# Patient Record
Sex: Female | Born: 1990 | Race: White | Hispanic: No | Marital: Married | Smoking: Never smoker
Health system: Southern US, Community
[De-identification: ages and names within clinical notes are randomized; demographics above are authoritative.]

## PROBLEM LIST (undated history)

## (undated) DIAGNOSIS — I10 Essential (primary) hypertension: Secondary | ICD-10-CM

## (undated) DIAGNOSIS — E039 Hypothyroidism, unspecified: Secondary | ICD-10-CM

## (undated) DIAGNOSIS — F419 Anxiety disorder, unspecified: Secondary | ICD-10-CM

## (undated) HISTORY — DX: Essential (primary) hypertension: I10

## (undated) HISTORY — DX: Anxiety disorder, unspecified: F41.9

## (undated) HISTORY — PX: WISDOM TOOTH EXTRACTION: SHX21

## (undated) HISTORY — DX: Hypothyroidism, unspecified: E03.9

---

## 2013-11-03 LAB — HM PAP SMEAR

## 2015-12-29 DIAGNOSIS — F4001 Agoraphobia with panic disorder: Secondary | ICD-10-CM | POA: Diagnosis not present

## 2015-12-29 DIAGNOSIS — F411 Generalized anxiety disorder: Secondary | ICD-10-CM | POA: Diagnosis not present

## 2016-09-20 DIAGNOSIS — R5383 Other fatigue: Secondary | ICD-10-CM | POA: Diagnosis not present

## 2016-09-20 DIAGNOSIS — Z87898 Personal history of other specified conditions: Secondary | ICD-10-CM | POA: Diagnosis not present

## 2016-11-03 ENCOUNTER — Encounter: Payer: Self-pay | Admitting: Family Medicine

## 2016-11-03 ENCOUNTER — Ambulatory Visit (INDEPENDENT_AMBULATORY_CARE_PROVIDER_SITE_OTHER): Payer: BLUE CROSS/BLUE SHIELD | Admitting: Family Medicine

## 2016-11-03 VITALS — BP 112/76 | HR 76 | Temp 98.1°F | Resp 16 | Ht 63.5 in | Wt 187.4 lb

## 2016-11-03 DIAGNOSIS — Z01419 Encounter for gynecological examination (general) (routine) without abnormal findings: Secondary | ICD-10-CM | POA: Diagnosis not present

## 2016-11-03 DIAGNOSIS — E669 Obesity, unspecified: Secondary | ICD-10-CM

## 2016-11-03 DIAGNOSIS — E663 Overweight: Secondary | ICD-10-CM | POA: Insufficient documentation

## 2016-11-03 DIAGNOSIS — F419 Anxiety disorder, unspecified: Secondary | ICD-10-CM | POA: Diagnosis not present

## 2016-11-03 DIAGNOSIS — R5383 Other fatigue: Secondary | ICD-10-CM | POA: Diagnosis not present

## 2016-11-03 LAB — CBC WITH DIFFERENTIAL/PLATELET
BASOS ABS: 0.2 10*3/uL — AB (ref 0.0–0.1)
BASOS PCT: 2.1 % (ref 0.0–3.0)
Eosinophils Absolute: 0.1 10*3/uL (ref 0.0–0.7)
Eosinophils Relative: 1.2 % (ref 0.0–5.0)
HEMATOCRIT: 39 % (ref 36.0–46.0)
Hemoglobin: 13 g/dL (ref 12.0–15.0)
LYMPHS PCT: 28.5 % (ref 12.0–46.0)
Lymphs Abs: 2.1 10*3/uL (ref 0.7–4.0)
MCHC: 33.4 g/dL (ref 30.0–36.0)
MCV: 85.5 fl (ref 78.0–100.0)
Monocytes Absolute: 0.4 10*3/uL (ref 0.1–1.0)
Monocytes Relative: 5.9 % (ref 3.0–12.0)
NEUTROS ABS: 4.5 10*3/uL (ref 1.4–7.7)
Neutrophils Relative %: 62.3 % (ref 43.0–77.0)
PLATELETS: 284 10*3/uL (ref 150.0–400.0)
RBC: 4.56 Mil/uL (ref 3.87–5.11)
RDW: 13.3 % (ref 11.5–15.5)
WBC: 7.3 10*3/uL (ref 4.0–10.5)

## 2016-11-03 LAB — BASIC METABOLIC PANEL
BUN: 18 mg/dL (ref 6–23)
CHLORIDE: 104 meq/L (ref 96–112)
CO2: 28 mEq/L (ref 19–32)
Calcium: 9.4 mg/dL (ref 8.4–10.5)
Creatinine, Ser: 0.93 mg/dL (ref 0.40–1.20)
GFR: 76.68 mL/min (ref >60.00)
Glucose, Bld: 95 mg/dL (ref 70–99)
POTASSIUM: 4.1 meq/L (ref 3.5–5.1)
Sodium: 138 mEq/L (ref 135–145)

## 2016-11-03 LAB — TSH: TSH: 1.9 u[IU]/mL (ref 0.35–4.50)

## 2016-11-03 LAB — T4, FREE: FREE T4: 0.68 ng/dL (ref 0.60–1.60)

## 2016-11-03 LAB — B12 AND FOLATE PANEL
FOLATE: 19.9 ng/mL (ref >5.9)
VITAMIN B 12: 427 pg/mL (ref 211–911)

## 2016-11-03 LAB — T3, FREE: T3 FREE: 3.6 pg/mL (ref 2.3–4.2)

## 2016-11-03 LAB — VITAMIN D 25 HYDROXY (VIT D DEFICIENCY, FRACTURES): VITD: 23.4 ng/mL — ABNORMAL LOW (ref 30.00–100.00)

## 2016-11-03 MED ORDER — SERTRALINE HCL 25 MG PO TABS: 25.0000 mg | ORAL_TABLET | Freq: Every day | ORAL | 3 refills | Status: DC

## 2016-11-03 NOTE — Patient Instructions (Signed)
Follow up in 2-3 months to recheck weight loss progress (unless labs indicate sooner!) We'll notify you of your lab results and make any changes if needed Continue to work on healthy diet and regular exercise- you can do it! We'll call you with your GYN appt Decrease the Zoloft to  daily Call with any questions or concerns Welcome!  We're glad to have you!!!

## 2016-11-03 NOTE — Assessment & Plan Note (Signed)
New to provider.  Ongoing for pt but worsening recently.  Check labs to assess for underlying metabolic causes.  Encouraged healthy diet and regular exercise- will follow.

## 2016-11-03 NOTE — Assessment & Plan Note (Signed)
New to provider, ongoing for pt.  Tends to center mostly on travel- particularly flight.  Feels day to day sxs are well controlled and is trying to wean off Zoloft.  Will decrease to 25 mg daily and monitor.  Pt expressed understanding and is in agreement w/ plan.

## 2016-11-03 NOTE — Assessment & Plan Note (Signed)
New.  Pt is very upset by her recent weight gain (in the last year).  Is attempting to eat well but admits to no exercise.  Stressed the need for both.  Check labs to r/o metabolic causes such as hypothyroid.  Will follow closely.

## 2016-11-03 NOTE — Progress Notes (Signed)
   Subjective:    Patient ID: Nancy Wong, female    DOB: November 03, 1990, 26 y.o.   MRN: [id number]  HPI New to establish.  Previous MD- Dr Crista Elliot, [place].  Needs GYN referral.  Weight gain/hot flashes- pt reports she has gained 'considerable weight' in the last year.  Pt reports she has been eating better but continues to gain weight.  Admits she is not exercising.  + 'exhausted'.  Pt reports she is sleeping 7-8 hrs/night but now requiring 1-2 hrs of sleep on the couch when she gets home from work.  + hot flashes.  Pt reports last TSH was 'mid to upper 3s'.  No family hx of thyroid issues.  Pt is considering pregnancy in the next year or so.  Pt is off birth control and periods are fairly regularly.  No reported snoring or breathing pauses.  Anxiety- chronic problem, currently on Zoloft  daily.  She is attempting to wean in hopes of pregnancy upcoming.  Pt tends to have severe anxiety with travel  Review of Systems For ROS see HPI     Objective:   Physical Exam  Constitutional: She is oriented to person, place, and time. She appears well-developed and well-nourished. No distress.  obese  HENT:  Head: Normocephalic and atraumatic.  Eyes: Conjunctivae and EOM are normal. Pupils are equal, round, and reactive to light.  Neck: Normal range of motion. Neck supple. No thyromegaly present.  Cardiovascular: Normal rate, regular rhythm, normal heart sounds and intact distal pulses.   No murmur heard. Pulmonary/Chest: Effort normal and breath sounds normal. No respiratory distress.  Abdominal: Soft. She exhibits no distension. There is no tenderness.  Musculoskeletal: She exhibits no edema.  Lymphadenopathy:    She has no cervical adenopathy.  Neurological: She is alert and oriented to person, place, and time.  Skin: Skin is warm and dry.  Psychiatric: She has a normal mood and affect. Her behavior is normal.  Vitals reviewed.         Assessment & Plan:

## 2016-11-03 NOTE — Progress Notes (Signed)
Pre visit review using our clinic review tool, if applicable. No additional management support is needed unless otherwise documented below in the visit note. 

## 2016-11-04 ENCOUNTER — Other Ambulatory Visit: Payer: Self-pay | Admitting: General Practice

## 2016-11-04 ENCOUNTER — Other Ambulatory Visit: Payer: Self-pay | Admitting: Family Medicine

## 2016-11-04 DIAGNOSIS — R7989 Other specified abnormal findings of blood chemistry: Secondary | ICD-10-CM

## 2016-11-04 DIAGNOSIS — R799 Abnormal finding of blood chemistry, unspecified: Secondary | ICD-10-CM

## 2016-11-04 MED ORDER — VITAMIN D (ERGOCALCIFEROL) 1.25 MG (50000 UNIT) PO CAPS: 50000.0000 [IU] | ORAL_CAPSULE | ORAL | 0 refills | Status: DC

## 2016-11-04 MED ORDER — LEVOTHYROXINE SODIUM 25 MCG PO TABS: 25.0000 ug | ORAL_TABLET | Freq: Every day | ORAL | 3 refills | Status: DC

## 2016-11-22 DIAGNOSIS — Z6833 Body mass index (BMI) 33.0-33.9, adult: Secondary | ICD-10-CM | POA: Diagnosis not present

## 2016-11-22 DIAGNOSIS — Z01419 Encounter for gynecological examination (general) (routine) without abnormal findings: Secondary | ICD-10-CM | POA: Diagnosis not present

## 2016-11-22 DIAGNOSIS — D509 Iron deficiency anemia, unspecified: Secondary | ICD-10-CM | POA: Diagnosis not present

## 2016-11-22 DIAGNOSIS — R5383 Other fatigue: Secondary | ICD-10-CM | POA: Diagnosis not present

## 2016-11-22 LAB — HM PAP SMEAR

## 2016-12-06 ENCOUNTER — Other Ambulatory Visit (INDEPENDENT_AMBULATORY_CARE_PROVIDER_SITE_OTHER): Payer: BLUE CROSS/BLUE SHIELD

## 2016-12-06 DIAGNOSIS — R799 Abnormal finding of blood chemistry, unspecified: Secondary | ICD-10-CM | POA: Diagnosis not present

## 2016-12-06 DIAGNOSIS — R7989 Other specified abnormal findings of blood chemistry: Secondary | ICD-10-CM

## 2016-12-06 LAB — TSH: TSH: 0.72 u[IU]/mL (ref 0.35–4.50)

## 2016-12-20 DIAGNOSIS — Z713 Dietary counseling and surveillance: Secondary | ICD-10-CM | POA: Diagnosis not present

## 2016-12-20 DIAGNOSIS — Z6831 Body mass index (BMI) 31.0-31.9, adult: Secondary | ICD-10-CM | POA: Diagnosis not present

## 2017-01-07 ENCOUNTER — Encounter: Payer: Self-pay | Admitting: General Practice

## 2017-01-10 ENCOUNTER — Ambulatory Visit (INDEPENDENT_AMBULATORY_CARE_PROVIDER_SITE_OTHER): Payer: BLUE CROSS/BLUE SHIELD | Admitting: Family Medicine

## 2017-01-10 ENCOUNTER — Encounter: Payer: Self-pay | Admitting: Family Medicine

## 2017-01-10 VITALS — BP 118/78 | HR 78 | Temp 98.0°F | Resp 16 | Ht 64.0 in | Wt 175.4 lb

## 2017-01-10 DIAGNOSIS — E669 Obesity, unspecified: Secondary | ICD-10-CM

## 2017-01-10 DIAGNOSIS — F419 Anxiety disorder, unspecified: Secondary | ICD-10-CM | POA: Diagnosis not present

## 2017-01-10 MED ORDER — ALPRAZOLAM 0.25 MG PO TABS: ORAL_TABLET | ORAL | 3 refills | Status: DC

## 2017-01-10 NOTE — Assessment & Plan Note (Signed)
Improved.  Pt has lost 12 lbs since last visit.  OB put her on Phentermine but she has also changed her diet and began exercising more regularly since her energy level has improved.  Will continue to follow.

## 2017-01-10 NOTE — Patient Instructions (Signed)
Schedule your complete physical for October Keep up the good work on [employer]healthy diet and regular exercise- you're doing great!!! Continue to use the Alprazolam only as needed Call with any questions or concerns Have a great summer!!!

## 2017-01-10 NOTE — Progress Notes (Signed)
   Subjective:    Patient ID: Nancy Wong, female    DOB: September 29, 1990, 26 y.o.   MRN: [id number]  HPI Obesity- pt has lost 12 lbs since last visit.  Pt is now taking Phentermine 37.5mg  daily.  Pt reports she is now walking more regularly, eating better.  Pt has also done some intermittent fasting.  Pt feels energy is better- no longer requiring a daily nap.  No CP, SOB, HAs, visual changes, edema.  Anxiety- pt is taking 50mg  Zoloft once daily.  Asking for refill on Alprazolam to use as needed.   Review of Systems For ROS see HPI     Objective:   Physical Exam  Constitutional: She is oriented to person, place, and time. She appears well-developed and well-nourished. No distress.  HENT:  Head: Normocephalic and atraumatic.  Eyes: Conjunctivae and EOM are normal. Pupils are equal, round, and reactive to light.  Neck: Normal range of motion. Neck supple. No thyromegaly present.  Cardiovascular: Normal rate, regular rhythm, normal heart sounds and intact distal pulses.   No murmur heard. Pulmonary/Chest: Effort normal and breath sounds normal. No respiratory distress.  Abdominal: Soft. She exhibits no distension. There is no tenderness.  Musculoskeletal: She exhibits no edema.  Lymphadenopathy:    She has no cervical adenopathy.  Neurological: She is alert and oriented to person, place, and time.  Skin: Skin is warm and dry.  Psychiatric: She has a normal mood and affect. Her behavior is normal.  Vitals reviewed.         Assessment & Plan:

## 2017-01-10 NOTE — Assessment & Plan Note (Signed)
Chronic problem.  Refill provided on Alprazolam.  Pt is again on Zoloft 50mg  daily.  Will follow.

## 2017-01-10 NOTE — Progress Notes (Signed)
Pre visit review using our clinic review tool, if applicable. No additional management support is needed unless otherwise documented below in the visit note. 

## 2017-01-14 ENCOUNTER — Ambulatory Visit: Payer: Self-pay | Admitting: Family Medicine

## 2017-02-08 DIAGNOSIS — J069 Acute upper respiratory infection, unspecified: Secondary | ICD-10-CM | POA: Diagnosis not present

## 2017-03-22 ENCOUNTER — Other Ambulatory Visit: Payer: Self-pay | Admitting: Family Medicine

## 2017-04-18 ENCOUNTER — Telehealth: Payer: Self-pay | Admitting: Family Medicine

## 2017-04-18 NOTE — Telephone Encounter (Signed)
Pt is on alprazolam for flying, pt took prior to flying. She felt her heart racing before take off so she took a second alprazolam before take off. Pt states that her anxiety attacks cause her to have severe nausea/vomiting. She proceeded to vomit the entire [place] practiced her breathing techniques and could not get herself to calm down until her flight landed.   Pt states that she is suppose to fly on Thursday to [place] and she is already feeling very nervous and panicky about this trip. Mostly because she has been on the Alprazolam for years for her anxiety and this is the first time it has not worked.   Please advise?

## 2017-04-18 NOTE — Telephone Encounter (Signed)
I would recommend taking 2 tabs at the same time (0.5mg ) prior to flying and based on her recent experience, I would take it upon arrival to the airport.  She can add an additional tab if needed upon boarding or mid-flight

## 2017-04-18 NOTE — Telephone Encounter (Signed)
Patient notified of PCP recommendations and is agreement and expresses an understanding.  

## 2017-04-18 NOTE — Telephone Encounter (Signed)
Pt states that she was on a trip to [place] for work in which she has a panic attack, she states that the xanax did not work very well for her. Pt states that she has another work trip at the end of the week traveling to [place], pt asking what else could she do, please advise

## 2017-05-16 ENCOUNTER — Encounter: Payer: BLUE CROSS/BLUE SHIELD | Admitting: Family Medicine

## 2017-07-06 ENCOUNTER — Encounter: Payer: Self-pay | Admitting: Family Medicine

## 2017-07-06 ENCOUNTER — Ambulatory Visit (INDEPENDENT_AMBULATORY_CARE_PROVIDER_SITE_OTHER): Payer: BLUE CROSS/BLUE SHIELD | Admitting: Family Medicine

## 2017-07-06 VITALS — BP 140/80 | HR 125 | Temp 98.9°F

## 2017-07-06 DIAGNOSIS — Z3A01 Less than 8 weeks gestation of pregnancy: Secondary | ICD-10-CM | POA: Diagnosis not present

## 2017-07-06 DIAGNOSIS — R6889 Other general symptoms and signs: Secondary | ICD-10-CM | POA: Diagnosis not present

## 2017-07-06 DIAGNOSIS — F419 Anxiety disorder, unspecified: Secondary | ICD-10-CM | POA: Diagnosis not present

## 2017-07-06 DIAGNOSIS — B349 Viral infection, unspecified: Secondary | ICD-10-CM | POA: Diagnosis not present

## 2017-07-06 DIAGNOSIS — R000 Tachycardia, unspecified: Secondary | ICD-10-CM | POA: Diagnosis not present

## 2017-07-06 LAB — POCT RAPID STREP A (OFFICE): RAPID STREP A SCREEN: NEGATIVE

## 2017-07-06 LAB — TSH: TSH: 1.05 u[IU]/mL (ref 0.35–4.50)

## 2017-07-06 LAB — T4, FREE: Free T4: 0.74 ng/dL (ref 0.60–1.60)

## 2017-07-06 LAB — POC INFLUENZA A&B (BINAX/QUICKVUE)
INFLUENZA B, POC: NEGATIVE
Influenza A, POC: NEGATIVE

## 2017-07-06 LAB — HCG, QUANTITATIVE, PREGNANCY: QUANTITATIVE HCG: 880.28 m[IU]/mL

## 2017-07-06 MED ORDER — ONDANSETRON 4 MG PO TBDP: 4.0000 mg | ORAL_TABLET | Freq: Once | ORAL | Status: DC

## 2017-07-06 MED ORDER — ONDANSETRON HCL 4 MG PO TABS: 4.0000 mg | ORAL_TABLET | Freq: Three times a day (TID) | ORAL | 0 refills | Status: DC | PRN

## 2017-07-06 MED ORDER — ONDANSETRON HCL 4 MG/2ML IJ SOLN
4.0000 mg | Freq: Once | INTRAMUSCULAR | Status: AC
  Administered 2017-07-06: 4 mg via INTRAMUSCULAR

## 2017-07-06 NOTE — Progress Notes (Signed)
Subjective   CC:  Chief Complaint  Patient presents with  . Hyperemesis Gravidarum    [redacted] weeks pregnant, home preg test positive  . Fever    99.9 to 100.4 past 24 hours, started last night  . Cough    x 2 days, expectorating green sputum    HPI: Nancy Wong is a 26 y.o. female who presents to the office today to address the problems listed above in the chief complaint.  Patient complains of flu like symptoms including myalgias, fever tto 100.4 last night, no antipyretics this am, ST, mild cough and some congestion, cough and nausea. Sxs have been present since late last night.No SOB or CP are present. Taking in fluids adequately; decreased appetite bt no significant v/d. She has not had the flu vaccine this season.   She is 3 days late for her menstrual cycles which are regular.  And at home urine pregnancy test was positive.  This is good news for her and her husband.  She is very anxious however, how this illness will affect the pregnancy.  Anxiety: She has been maintained well recently on low-dose Zoloft.  She admits that she is feeling very anxious and would like to increase her dose.  She has not used Xanax recently.  Thyroid replacement: She was started on low-dose thyroid replacement in April.  TSH and free T4 were normal at that time.  She denies palpitations or tremors.  Anxiety is increased.  I reviewed the patients updated PMH, FH, and SocHx.    Patient Active Problem List   Diagnosis Date Noted  . Obesity (BMI 30-39.9) 11/03/2016  . Fatigue 11/03/2016  . Anxiety 11/03/2016   Current Meds  Medication Sig  . levothyroxine (SYNTHROID, LEVOTHROID) 25 MCG tablet TAKE 1 TABLET(25 MCG) BY MOUTH DAILY BEFORE BREAKFAST  . sertraline (ZOLOFT) 100 MG tablet Take 50 mg by mouth daily.   Family History: Patient family history includes Cancer in her mother; Diabetes in her maternal grandmother and paternal grandfather; Healthy in her brother; Heart attack in her paternal  grandfather; Kidney disease in her father. Social History:  Patient  reports that  has never smoked. she has never used smokeless tobacco. She reports that she does not drink alcohol or use drugs.  Review of Systems: Constitutional: negative for fever or malaise Ophthalmic: negative for photophobia, double vision or loss of vision Cardiovascular: negative for chest pain, dyspnea on exertion, or new LE swelling Respiratory: negative for SOB or persistent cough Gastrointestinal: negative for abdominal pain, change in bowel habits or melena Genitourinary: negative for dysuria or gross hematuria Musculoskeletal: negative for new gait disturbance or muscular weakness Integumentary: negative for new or persistent rashes Neurological: negative for TIA or stroke symptoms Psychiatric: negative for SI or delusions  Allergic/Immunologic: negative for hives  Objective  Vitals: BP 140/80 (BP Location: Left Arm, Patient Position: Sitting, Cuff Size: Normal)   Pulse (!) 125   Temp 98.9 F (37.2 C) (Oral)   LMP 06/04/2017   SpO2 98%  General: no acute respiratory distress , lying in bed with wet cloth and forehead. Psych:  Alert and oriented, anxious mood and affect HEENT: Normocephalic, nasal congestion present, TMs w/o erythema, OP with erythema w/o exudate, no LAD, supple neck  Cardiovascular: Tachycardic with regular rhythm without murmur or gallop. no peripheral edema Respiratory:  Good breath sounds bilaterally, CTAB with normal respiratory effort, no rales or wheezing Gastrointestinal: soft, flat abdomen, normal active bowel sounds, no palpable masses, no hepatosplenomegaly, no appreciated  hernias Skin:  Warm, no rashes Neurologic:   Mental status is normal. normal gait, no tremor  Assessment  1. Viral syndrome   2. Flu-like symptoms   3. Less than [redacted] weeks gestation of pregnancy   4. Anxiety   5. Tachycardia      Plan   Symptoms most consistent with a viral syndrome, flulike  illness.  Will support conservatively given early pregnancy.  Counseling done.  Recommend Tylenol for fever or body aches.  Robitussin for cough.  Zofran for nausea as needed.  Discussed risks and benefits of medications.  Avoid Advil if possible.  Avoid Xanax.    Anxiety: May increase Zoloft to 50 mg daily.  Thyroid replacement: Recheck TSH to ensure not hyperthyroid.  Patient will need to make an appointment with OB in the next 1-3 months.  Follow up: Follow-up in 48 hours if not improved.   Commons side effects, risks, benefits, and alternatives for medications and treatment plan prescribed today were discussed, and the patient expressed understanding of the given instructions. Patient is instructed to call or message via MyChart if he/she has any questions or concerns regarding our treatment plan. No barriers to understanding were identified. We discussed Red Flag symptoms and signs in detail. Patient expressed understanding regarding what to do in case of urgent or emergency type symptoms.   Medication list was reconciled, printed and provided to the patient in AVS. Patient instructions and summary information was reviewed with the patient as documented in the AVS. This note was prepared with assistance of Dragon voice recognition software. Occasional wrong-word or sound-a-like substitutions may have occurred due to the inherent limitations of voice recognition software  Orders Placed This Encounter  Procedures  . TSH  . T4, free  . B-HCG Quant  . POC Influenza A&B (Binax test)  . POCT rapid strep A   Meds ordered this encounter  Medications  . DISCONTD: ondansetron (ZOFRAN-ODT) disintegrating tablet 4 mg  . ondansetron (ZOFRAN) 4 MG tablet    Sig: Take 1 tablet (4 mg total) by mouth every 8 (eight) hours as needed for nausea or vomiting.    Dispense:  20 tablet    Refill:  0  . ondansetron (ZOFRAN) injection 4 mg

## 2017-07-06 NOTE — Patient Instructions (Signed)
Sorry you are not feeling well. Please keep hydrated - water, gatorade, clear liquids/soups etc. Stick to a bland diet right now.  Use the zofran as needed for nausea. Use Tylenol for aches and fevers. Avoid advil if possible.  You may follow up in 48 hours if not improving.    Viral Respiratory Infection A viral respiratory infection is an illness that affects parts of the body used for breathing, like the lungs, nose, and throat. It is caused by a germ called a virus. Some examples of this kind of infection are:  A cold.  The flu (influenza).  A respiratory syncytial virus (RSV) infection.  How do I know if I have this infection? Most of the time this infection causes:  A stuffy or runny nose.  Yellow or green fluid in the nose.  A cough.  Sneezing.  Tiredness (fatigue).  Achy muscles.  A sore throat.  Sweating or chills.  A fever.  A headache.  How is this infection treated? If the flu is diagnosed early, it may be treated with an antiviral medicine. This medicine shortens the length of time a person has symptoms. Symptoms may be treated with over-the-counter and prescription medicines, such as:  Expectorants. These make it easier to cough up mucus.  Decongestant nasal sprays.  Doctors do not prescribe antibiotic medicines for viral infections. They do not work with this kind of infection. How do I know if I should stay home? To keep others from getting sick, stay home if you have:  A fever.  A lasting cough.  A sore throat.  A runny nose.  Sneezing.  Muscles aches.  Headaches.  Tiredness.  Weakness.  Chills.  Sweating.  An upset stomach (nausea).  Follow these instructions at home:  Rest as much as possible.  Take over-the-counter and prescription medicines only as told by your doctor.  Drink enough fluid to keep your pee (urine) clear or pale yellow.  Gargle with salt water. Do this 3-4 times per day or as needed. To make a  salt-water mixture, dissolve -1 tsp of salt in 1 cup of warm water. Make sure the salt dissolves all the way.  Use nose drops made from salt water. This helps with stuffiness (congestion). It also helps soften the skin around your nose.  Do not drink alcohol.  Do not use tobacco products, including cigarettes, chewing tobacco, and e-cigarettes. If you need help quitting, ask your doctor. Get help if:  Your symptoms last for 10 days or longer.  Your symptoms get worse over time.  You have a fever.  You have very bad pain in your face or forehead.  Parts of your jaw or neck become very swollen. Get help right away if:  You feel pain or pressure in your chest.  You have shortness of breath.  You faint or feel like you will faint.  You keep throwing up (vomiting).  You feel confused. This information is not intended to replace advice given to you by your health care provider. Make sure you discuss any questions you have with your health care provider. Document Released: 06/17/2008 Document Revised: 12/11/2015 Document Reviewed: 12/11/2014 Elsevier Interactive Patient Education  2018 [employer]

## 2017-07-08 ENCOUNTER — Ambulatory Visit: Payer: BLUE CROSS/BLUE SHIELD | Admitting: Family Medicine

## 2017-07-28 DIAGNOSIS — N911 Secondary amenorrhea: Secondary | ICD-10-CM | POA: Diagnosis not present

## 2017-08-03 DIAGNOSIS — Z23 Encounter for immunization: Secondary | ICD-10-CM | POA: Diagnosis not present

## 2017-08-03 DIAGNOSIS — Z3401 Encounter for supervision of normal first pregnancy, first trimester: Secondary | ICD-10-CM | POA: Diagnosis not present

## 2017-08-03 DIAGNOSIS — Z3685 Encounter for antenatal screening for Streptococcus B: Secondary | ICD-10-CM | POA: Diagnosis not present

## 2017-08-03 LAB — OB RESULTS CONSOLE ANTIBODY SCREEN: Antibody Screen: NEGATIVE

## 2017-08-03 LAB — OB RESULTS CONSOLE RPR: RPR: NONREACTIVE

## 2017-08-03 LAB — OB RESULTS CONSOLE GC/CHLAMYDIA
Chlamydia: NEGATIVE
GC PROBE AMP, GENITAL: NEGATIVE

## 2017-08-03 LAB — OB RESULTS CONSOLE ABO/RH: RH Type: POSITIVE

## 2017-08-03 LAB — OB RESULTS CONSOLE RUBELLA ANTIBODY, IGM: RUBELLA: IMMUNE

## 2017-08-03 LAB — OB RESULTS CONSOLE HIV ANTIBODY (ROUTINE TESTING): HIV: NONREACTIVE

## 2017-08-03 LAB — OB RESULTS CONSOLE HEPATITIS B SURFACE ANTIGEN: Hepatitis B Surface Ag: NEGATIVE

## 2017-08-15 DIAGNOSIS — Z113 Encounter for screening for infections with a predominantly sexual mode of transmission: Secondary | ICD-10-CM | POA: Diagnosis not present

## 2017-08-15 DIAGNOSIS — Z3481 Encounter for supervision of other normal pregnancy, first trimester: Secondary | ICD-10-CM | POA: Diagnosis not present

## 2017-08-15 DIAGNOSIS — Z3A10 10 weeks gestation of pregnancy: Secondary | ICD-10-CM | POA: Diagnosis not present

## 2017-08-15 DIAGNOSIS — Z3400 Encounter for supervision of normal first pregnancy, unspecified trimester: Secondary | ICD-10-CM | POA: Diagnosis not present

## 2017-08-15 DIAGNOSIS — Z3480 Encounter for supervision of other normal pregnancy, unspecified trimester: Secondary | ICD-10-CM | POA: Diagnosis not present

## 2017-08-15 DIAGNOSIS — Z3401 Encounter for supervision of normal first pregnancy, first trimester: Secondary | ICD-10-CM | POA: Diagnosis not present

## 2017-08-30 DIAGNOSIS — Z3683 Encounter for fetal screening for congenital cardiac abnormalities: Secondary | ICD-10-CM | POA: Diagnosis not present

## 2017-09-28 ENCOUNTER — Telehealth: Payer: BLUE CROSS/BLUE SHIELD | Admitting: Nurse Practitioner

## 2017-09-28 DIAGNOSIS — J309 Allergic rhinitis, unspecified: Secondary | ICD-10-CM | POA: Diagnosis not present

## 2017-09-28 DIAGNOSIS — R112 Nausea with vomiting, unspecified: Secondary | ICD-10-CM

## 2017-09-28 NOTE — Progress Notes (Signed)
Based on what you shared with me it looks like you have a serious condition that should be evaluated in a face to face office visit.  NOTE: If you entered your credit card information for this eVisit, you will not be charged. You may see a "hold" on your card for the $30 but that hold will drop off and you will not have a charge processed.   *ypu really ned to see your OB/GYN due to your pregnancy    If you are having a true medical emergency please call 911.  If you need an urgent face to face visit, [place] has four urgent care centers for your convenience.  If you need care fast and have a high deductible or no insurance consider:   [email or web address] to reserve your spot online an avoid wait times  [facility] [address], [address] [place], [place] [address] 8 am to 8 pm Monday-Friday 10 am to 4 pm Saturday-Sunday *Across the street from [employer]  [facility]  [address] [place], [id number] 8 am to 5 pm Monday-Friday * In the [facility] on the [facility]   The following sites will take your  insurance:  . [facility]  [phone] Get Driving Directions Find a Provider at this Location  [address] [place], [place] [address] . 10 am to 8 pm Monday-Friday . 12 pm to 8 pm Saturday-Sunday   . [facility]  [phone] Get Driving Directions Find a Provider at this Location  [address] [place], [place] [address] . 8 am to 8 pm Monday-Friday . 9 am to 6 pm Saturday . 11 am to 6 pm Sunday   . [facility] [place]  [phone] Get Driving Directions  [address] [place], [place] [address] . 8 am to 8 pm Monday-Friday . 8 am to 4 pm Saturday-Sunday   Your e-visit answers were reviewed by a board certified advanced clinical practitioner to complete your personal care plan.  Thank you for  using e-Visits.

## 2017-10-10 DIAGNOSIS — Z363 Encounter for antenatal screening for malformations: Secondary | ICD-10-CM | POA: Diagnosis not present

## 2017-10-10 DIAGNOSIS — Z3A18 18 weeks gestation of pregnancy: Secondary | ICD-10-CM | POA: Diagnosis not present

## 2017-11-14 DIAGNOSIS — Z362 Encounter for other antenatal screening follow-up: Secondary | ICD-10-CM | POA: Diagnosis not present

## 2017-11-18 DIAGNOSIS — R8271 Bacteriuria: Secondary | ICD-10-CM | POA: Diagnosis not present

## 2017-11-18 DIAGNOSIS — O9989 Other specified diseases and conditions complicating pregnancy, childbirth and the puerperium: Secondary | ICD-10-CM | POA: Diagnosis not present

## 2017-11-18 DIAGNOSIS — M7989 Other specified soft tissue disorders: Secondary | ICD-10-CM | POA: Diagnosis not present

## 2017-11-18 DIAGNOSIS — Z3A24 24 weeks gestation of pregnancy: Secondary | ICD-10-CM | POA: Diagnosis not present

## 2017-12-13 DIAGNOSIS — Z23 Encounter for immunization: Secondary | ICD-10-CM | POA: Diagnosis not present

## 2017-12-15 DIAGNOSIS — Z3480 Encounter for supervision of other normal pregnancy, unspecified trimester: Secondary | ICD-10-CM | POA: Diagnosis not present

## 2017-12-19 DIAGNOSIS — O99810 Abnormal glucose complicating pregnancy: Secondary | ICD-10-CM | POA: Diagnosis not present

## 2017-12-19 DIAGNOSIS — Z3A28 28 weeks gestation of pregnancy: Secondary | ICD-10-CM | POA: Diagnosis not present

## 2018-02-06 DIAGNOSIS — Z3A35 35 weeks gestation of pregnancy: Secondary | ICD-10-CM | POA: Diagnosis not present

## 2018-02-06 DIAGNOSIS — Z3688 Encounter for antenatal screening for fetal macrosomia: Secondary | ICD-10-CM | POA: Diagnosis not present

## 2018-02-06 DIAGNOSIS — Z3685 Encounter for antenatal screening for Streptococcus B: Secondary | ICD-10-CM | POA: Diagnosis not present

## 2018-02-06 LAB — OB RESULTS CONSOLE GBS: GBS: NEGATIVE

## 2018-02-16 DIAGNOSIS — Z3480 Encounter for supervision of other normal pregnancy, unspecified trimester: Secondary | ICD-10-CM | POA: Diagnosis not present

## 2018-02-16 DIAGNOSIS — Z3A36 36 weeks gestation of pregnancy: Secondary | ICD-10-CM | POA: Diagnosis not present

## 2018-02-16 DIAGNOSIS — O26893 Other specified pregnancy related conditions, third trimester: Secondary | ICD-10-CM | POA: Diagnosis not present

## 2018-02-16 DIAGNOSIS — I10 Essential (primary) hypertension: Secondary | ICD-10-CM | POA: Diagnosis not present

## 2018-02-17 ENCOUNTER — Encounter (HOSPITAL_COMMUNITY): Payer: Self-pay | Admitting: *Deleted

## 2018-02-17 ENCOUNTER — Telehealth (HOSPITAL_COMMUNITY): Payer: Self-pay | Admitting: *Deleted

## 2018-02-17 NOTE — Telephone Encounter (Signed)
Preadmission screen  

## 2018-02-20 ENCOUNTER — Inpatient Hospital Stay (HOSPITAL_COMMUNITY): Payer: BLUE CROSS/BLUE SHIELD | Admitting: Anesthesiology

## 2018-02-20 ENCOUNTER — Inpatient Hospital Stay (HOSPITAL_COMMUNITY)
Admission: RE | Admit: 2018-02-20 | Discharge: 2018-02-23 | DRG: 788 | Disposition: A | Discharge status: Home / Self Care | Payer: BLUE CROSS/BLUE SHIELD | Attending: Obstetrics and Gynecology | Admitting: Obstetrics and Gynecology

## 2018-02-20 ENCOUNTER — Encounter (HOSPITAL_COMMUNITY): Payer: Self-pay

## 2018-02-20 ENCOUNTER — Other Ambulatory Visit: Payer: Self-pay

## 2018-02-20 ENCOUNTER — Encounter (HOSPITAL_COMMUNITY)
Admission: RE | Disposition: A | Discharge status: Home / Self Care | Payer: Self-pay | Source: Home / Self Care | Attending: Obstetrics and Gynecology

## 2018-02-20 DIAGNOSIS — O1404 Mild to moderate pre-eclampsia, complicating childbirth: Secondary | ICD-10-CM | POA: Diagnosis not present

## 2018-02-20 DIAGNOSIS — E039 Hypothyroidism, unspecified: Secondary | ICD-10-CM | POA: Diagnosis not present

## 2018-02-20 DIAGNOSIS — O1490 Unspecified pre-eclampsia, unspecified trimester: Secondary | ICD-10-CM | POA: Diagnosis present

## 2018-02-20 DIAGNOSIS — Z98891 History of uterine scar from previous surgery: Secondary | ICD-10-CM

## 2018-02-20 DIAGNOSIS — Z3A37 37 weeks gestation of pregnancy: Secondary | ICD-10-CM | POA: Diagnosis not present

## 2018-02-20 DIAGNOSIS — F419 Anxiety disorder, unspecified: Secondary | ICD-10-CM | POA: Diagnosis present

## 2018-02-20 DIAGNOSIS — O99284 Endocrine, nutritional and metabolic diseases complicating childbirth: Secondary | ICD-10-CM | POA: Diagnosis present

## 2018-02-20 DIAGNOSIS — O99344 Other mental disorders complicating childbirth: Secondary | ICD-10-CM | POA: Diagnosis not present

## 2018-02-20 DIAGNOSIS — O1494 Unspecified pre-eclampsia, complicating childbirth: Principal | ICD-10-CM | POA: Diagnosis present

## 2018-02-20 DIAGNOSIS — O134 Gestational [pregnancy-induced] hypertension without significant proteinuria, complicating childbirth: Secondary | ICD-10-CM | POA: Diagnosis not present

## 2018-02-20 HISTORY — DX: History of uterine scar from previous surgery: Z98.891

## 2018-02-20 HISTORY — DX: Unspecified pre-eclampsia, unspecified trimester: O14.90

## 2018-02-20 LAB — COMPREHENSIVE METABOLIC PANEL
ALK PHOS: 146 U/L — AB (ref 38–126)
ALT: 18 U/L (ref 0–44)
ALT: 18 U/L (ref 0–44)
ANION GAP: 12 (ref 5–15)
AST: 19 U/L (ref 15–41)
AST: 30 U/L (ref 15–41)
Albumin: 3 g/dL — ABNORMAL LOW (ref 3.5–5.0)
Albumin: 3 g/dL — ABNORMAL LOW (ref 3.5–5.0)
Alkaline Phosphatase: 140 U/L — ABNORMAL HIGH (ref 38–126)
Anion gap: 11 (ref 5–15)
BILIRUBIN TOTAL: 0.4 mg/dL (ref 0.3–1.2)
BUN: 19 mg/dL (ref 6–20)
BUN: 16 mg/dL (ref 6–20)
CALCIUM: 8.4 mg/dL — AB (ref 8.9–10.3)
CHLORIDE: 101 mmol/L (ref 98–111)
CO2: 18 mmol/L — ABNORMAL LOW (ref 22–32)
CO2: 19 mmol/L — AB (ref 22–32)
CREATININE: 0.93 mg/dL (ref 0.44–1.00)
Calcium: 9.3 mg/dL (ref 8.9–10.3)
Chloride: 105 mmol/L (ref 98–111)
Creatinine, Ser: 0.92 mg/dL (ref 0.44–1.00)
GFR calc Af Amer: >60 mL/min (ref >60)
GFR calc non Af Amer: >60 mL/min (ref >60)
GLUCOSE: 93 mg/dL (ref 70–99)
Glucose, Bld: 93 mg/dL (ref 70–99)
Potassium: 4 mmol/L (ref 3.5–5.1)
Potassium: 4.1 mmol/L (ref 3.5–5.1)
SODIUM: 132 mmol/L — AB (ref 135–145)
Sodium: 134 mmol/L — ABNORMAL LOW (ref 135–145)
TOTAL PROTEIN: 6.2 g/dL — AB (ref 6.5–8.1)
Total Bilirubin: 0.7 mg/dL (ref 0.3–1.2)
Total Protein: 6.9 g/dL (ref 6.5–8.1)

## 2018-02-20 LAB — CBC
HCT: 33.5 % — ABNORMAL LOW (ref 36.0–46.0)
HCT: 35.2 % — ABNORMAL LOW (ref 36.0–46.0)
HCT: 35.7 % — ABNORMAL LOW (ref 36.0–46.0)
HEMATOCRIT: 31.9 % — AB (ref 36.0–46.0)
HEMOGLOBIN: 12.4 g/dL (ref 12.0–15.0)
Hemoglobin: 11.5 g/dL — ABNORMAL LOW (ref 12.0–15.0)
Hemoglobin: 12 g/dL (ref 12.0–15.0)
Hemoglobin: 10.9 g/dL — ABNORMAL LOW (ref 12.0–15.0)
MCH: 30.2 pg (ref 26.0–34.0)
MCH: 29.9 pg (ref 26.0–34.0)
MCH: 30.5 pg (ref 26.0–34.0)
MCH: 30.4 pg (ref 26.0–34.0)
MCHC: 34.3 g/dL (ref 30.0–36.0)
MCHC: 34.1 g/dL (ref 30.0–36.0)
MCHC: 34.7 g/dL (ref 30.0–36.0)
MCHC: 34.2 g/dL (ref 30.0–36.0)
MCV: 87.9 fL (ref 78.0–100.0)
MCV: 87.8 fL (ref 78.0–100.0)
MCV: 87.9 fL (ref 78.0–100.0)
MCV: 89.1 fL (ref 78.0–100.0)
PLATELETS: 191 10*3/uL (ref 150–400)
PLATELETS: 209 10*3/uL (ref 150–400)
Platelets: 213 10*3/uL (ref 150–400)
Platelets: 197 10*3/uL (ref 150–400)
RBC: 3.81 MIL/uL — ABNORMAL LOW (ref 3.87–5.11)
RBC: 4.01 MIL/uL (ref 3.87–5.11)
RBC: 4.06 MIL/uL (ref 3.87–5.11)
RBC: 3.58 MIL/uL — AB (ref 3.87–5.11)
RDW: 13.7 % (ref 11.5–15.5)
RDW: 13.5 % (ref 11.5–15.5)
RDW: 13.7 % (ref 11.5–15.5)
RDW: 13.9 % (ref 11.5–15.5)
WBC: 10.2 10*3/uL (ref 4.0–10.5)
WBC: 11.1 10*3/uL — ABNORMAL HIGH (ref 4.0–10.5)
WBC: 12.5 10*3/uL — AB (ref 4.0–10.5)
WBC: 12.6 10*3/uL — AB (ref 4.0–10.5)

## 2018-02-20 LAB — ABO/RH: ABO/RH(D): A POS

## 2018-02-20 LAB — TYPE AND SCREEN
ABO/RH(D): A POS
Antibody Screen: NEGATIVE

## 2018-02-20 LAB — RPR: RPR Ser Ql: NONREACTIVE

## 2018-02-20 SURGERY — Surgical Case
Anesthesia: Epidural

## 2018-02-20 MED ORDER — MISOPROSTOL 25 MCG QUARTER TABLET
25.0000 ug | ORAL_TABLET | ORAL | Status: DC | PRN
  Administered 2018-02-20 (×3): 25 ug via VAGINAL
  Filled 2018-02-20 (×3): qty 1

## 2018-02-20 MED ORDER — ONDANSETRON HCL 4 MG/2ML IJ SOLN: 4.0000 mg | Freq: Three times a day (TID) | INTRAMUSCULAR | Status: DC | PRN

## 2018-02-20 MED ORDER — DEXAMETHASONE SODIUM PHOSPHATE 4 MG/ML IJ SOLN
INTRAMUSCULAR | Status: DC | PRN
  Administered 2018-02-20: 4 mg via INTRAVENOUS

## 2018-02-20 MED ORDER — ACETAMINOPHEN 325 MG PO TABS: 650.0000 mg | ORAL_TABLET | ORAL | Status: DC | PRN

## 2018-02-20 MED ORDER — DIPHENHYDRAMINE HCL 25 MG PO CAPS: 25.0000 mg | ORAL_CAPSULE | Freq: Four times a day (QID) | ORAL | Status: DC | PRN

## 2018-02-20 MED ORDER — SIMETHICONE 80 MG PO CHEW: 80.0000 mg | CHEWABLE_TABLET | ORAL | Status: DC | PRN

## 2018-02-20 MED ORDER — EPHEDRINE 5 MG/ML INJ: 10.0000 mg | INTRAVENOUS | Status: DC | PRN

## 2018-02-20 MED ORDER — FENTANYL CITRATE (PF) 100 MCG/2ML IJ SOLN
INTRAMUSCULAR | Status: AC
  Filled 2018-02-20: qty 2

## 2018-02-20 MED ORDER — ZOLPIDEM TARTRATE 5 MG PO TABS: 5.0000 mg | ORAL_TABLET | Freq: Every evening | ORAL | Status: DC | PRN

## 2018-02-20 MED ORDER — ONDANSETRON HCL 4 MG/2ML IJ SOLN
INTRAMUSCULAR | Status: AC
  Filled 2018-02-20: qty 4

## 2018-02-20 MED ORDER — LIDOCAINE HCL (PF) 1 % IJ SOLN
INTRAMUSCULAR | Status: DC | PRN
  Administered 2018-02-20 (×2): 5 mL via EPIDURAL

## 2018-02-20 MED ORDER — PHENYLEPHRINE HCL 10 MG/ML IJ SOLN
INTRAMUSCULAR | Status: DC | PRN
  Administered 2018-02-20: 40 ug via INTRAVENOUS

## 2018-02-20 MED ORDER — FENTANYL 2.5 MCG/ML BUPIVACAINE 1/10 % EPIDURAL INFUSION (WH - ANES)
14.0000 mL/h | INTRAMUSCULAR | Status: DC | PRN
  Administered 2018-02-20: 14 mL/h via EPIDURAL
  Filled 2018-02-20: qty 100

## 2018-02-20 MED ORDER — SERTRALINE HCL 50 MG PO TABS
50.0000 mg | ORAL_TABLET | Freq: Every day | ORAL | Status: DC
  Administered 2018-02-21 – 2018-02-22 (×3): 50 mg via ORAL
  Filled 2018-02-20 (×4): qty 1

## 2018-02-20 MED ORDER — OXYCODONE-ACETAMINOPHEN 5-325 MG PO TABS: 2.0000 | ORAL_TABLET | ORAL | Status: DC | PRN

## 2018-02-20 MED ORDER — FENTANYL CITRATE (PF) 100 MCG/2ML IJ SOLN
INTRAMUSCULAR | Status: DC | PRN
  Administered 2018-02-20: 100 ug via EPIDURAL

## 2018-02-20 MED ORDER — SODIUM BICARBONATE 8.4 % IV SOLN
INTRAVENOUS | Status: AC
  Filled 2018-02-20: qty 50

## 2018-02-20 MED ORDER — HYDROXYZINE HCL 50 MG PO TABS
50.0000 mg | ORAL_TABLET | Freq: Four times a day (QID) | ORAL | Status: DC | PRN
  Administered 2018-02-20: 50 mg via ORAL
  Filled 2018-02-20 (×2): qty 1

## 2018-02-20 MED ORDER — OXYCODONE-ACETAMINOPHEN 5-325 MG PO TABS
1.0000 | ORAL_TABLET | ORAL | Status: DC | PRN
  Administered 2018-02-21 – 2018-02-23 (×2): 1 via ORAL
  Filled 2018-02-20 (×2): qty 1

## 2018-02-20 MED ORDER — LEVOTHYROXINE SODIUM 25 MCG PO TABS
25.0000 ug | ORAL_TABLET | Freq: Every day | ORAL | Status: DC
  Administered 2018-02-23: 25 ug via ORAL
  Filled 2018-02-20 (×4): qty 1

## 2018-02-20 MED ORDER — SENNOSIDES-DOCUSATE SODIUM 8.6-50 MG PO TABS
2.0000 | ORAL_TABLET | ORAL | Status: DC
  Administered 2018-02-21 – 2018-02-22 (×3): 2 via ORAL
  Filled 2018-02-20 (×2): qty 2

## 2018-02-20 MED ORDER — OXYTOCIN 40 UNITS IN LACTATED RINGERS INFUSION - SIMPLE MED
1.0000 m[IU]/min | INTRAVENOUS | Status: DC
  Administered 2018-02-20: 2 m[IU]/min via INTRAVENOUS

## 2018-02-20 MED ORDER — PRENATAL MULTIVITAMIN CH
1.0000 | ORAL_TABLET | Freq: Every day | ORAL | Status: DC
  Administered 2018-02-21 – 2018-02-22 (×2): 1 via ORAL
  Filled 2018-02-20 (×2): qty 1

## 2018-02-20 MED ORDER — OXYCODONE-ACETAMINOPHEN 5-325 MG PO TABS: 1.0000 | ORAL_TABLET | ORAL | Status: DC | PRN

## 2018-02-20 MED ORDER — SODIUM CHLORIDE 0.9% FLUSH: 3.0000 mL | INTRAVENOUS | Status: DC | PRN

## 2018-02-20 MED ORDER — DEXAMETHASONE SODIUM PHOSPHATE 4 MG/ML IJ SOLN
INTRAMUSCULAR | Status: AC
  Filled 2018-02-20: qty 1

## 2018-02-20 MED ORDER — OXYTOCIN BOLUS FROM INFUSION: 500.0000 mL | Freq: Once | INTRAVENOUS | Status: DC

## 2018-02-20 MED ORDER — ACETAMINOPHEN 10 MG/ML IV SOLN
INTRAVENOUS | Status: AC
Start: 2018-02-20 — End: 2018-02-20
  Filled 2018-02-20: qty 100

## 2018-02-20 MED ORDER — LACTATED RINGERS IV SOLN
INTRAVENOUS | Status: DC
  Administered 2018-02-20 (×3): via INTRAVENOUS

## 2018-02-20 MED ORDER — DIPHENHYDRAMINE HCL 50 MG/ML IJ SOLN: 12.5000 mg | INTRAMUSCULAR | Status: DC | PRN

## 2018-02-20 MED ORDER — WITCH HAZEL-GLYCERIN EX PADS: 1.0000 "application " | MEDICATED_PAD | CUTANEOUS | Status: DC | PRN

## 2018-02-20 MED ORDER — IBUPROFEN 600 MG PO TABS
600.0000 mg | ORAL_TABLET | Freq: Four times a day (QID) | ORAL | Status: DC
  Administered 2018-02-21 – 2018-02-23 (×10): 600 mg via ORAL
  Filled 2018-02-20 (×10): qty 1

## 2018-02-20 MED ORDER — OXYCODONE-ACETAMINOPHEN 5-325 MG PO TABS
2.0000 | ORAL_TABLET | ORAL | Status: DC | PRN
  Administered 2018-02-21 – 2018-02-22 (×10): 2 via ORAL
  Filled 2018-02-20 (×10): qty 2

## 2018-02-20 MED ORDER — LIDOCAINE-EPINEPHRINE (PF) 2 %-1:200000 IJ SOLN
INTRAMUSCULAR | Status: DC | PRN
  Administered 2018-02-20: 8 mL via EPIDURAL
  Administered 2018-02-20: 2 mL via EPIDURAL

## 2018-02-20 MED ORDER — LABETALOL HCL 5 MG/ML IV SOLN: 40.0000 mg | INTRAVENOUS | Status: DC | PRN

## 2018-02-20 MED ORDER — OXYTOCIN 10 UNIT/ML IJ SOLN
INTRAMUSCULAR | Status: AC
  Filled 2018-02-20: qty 4

## 2018-02-20 MED ORDER — TETANUS-DIPHTH-ACELL PERTUSSIS 5-2.5-18.5 LF-MCG/0.5 IM SUSP: 0.5000 mL | Freq: Once | INTRAMUSCULAR | Status: DC

## 2018-02-20 MED ORDER — PHENYLEPHRINE 40 MCG/ML (10ML) SYRINGE FOR IV PUSH (FOR BLOOD PRESSURE SUPPORT)
80.0000 ug | PREFILLED_SYRINGE | INTRAVENOUS | Status: AC | PRN
  Administered 2018-02-20 (×3): 80 ug via INTRAVENOUS

## 2018-02-20 MED ORDER — NALOXONE HCL 0.4 MG/ML IJ SOLN: 0.4000 mg | INTRAMUSCULAR | Status: DC | PRN

## 2018-02-20 MED ORDER — SOD CITRATE-CITRIC ACID 500-334 MG/5ML PO SOLN
30.0000 mL | ORAL | Status: DC | PRN
  Administered 2018-02-20: 30 mL via ORAL
  Filled 2018-02-20: qty 15

## 2018-02-20 MED ORDER — MISOPROSTOL 50MCG HALF TABLET
50.0000 ug | ORAL_TABLET | Freq: Once | ORAL | Status: AC
  Administered 2018-02-20: 50 ug via ORAL
  Filled 2018-02-20: qty 1

## 2018-02-20 MED ORDER — HYDRALAZINE HCL 20 MG/ML IJ SOLN: 10.0000 mg | INTRAMUSCULAR | Status: DC | PRN

## 2018-02-20 MED ORDER — ACETAMINOPHEN 10 MG/ML IV SOLN
INTRAVENOUS | Status: DC | PRN
  Administered 2018-02-20: 1000 mg via INTRAVENOUS

## 2018-02-20 MED ORDER — TERBUTALINE SULFATE 1 MG/ML IJ SOLN: 0.2500 mg | Freq: Once | INTRAMUSCULAR | Status: DC | PRN

## 2018-02-20 MED ORDER — MAGNESIUM SULFATE BOLUS VIA INFUSION
4.0000 g | Freq: Once | INTRAVENOUS | Status: AC
  Administered 2018-02-20: 4 g via INTRAVENOUS
  Filled 2018-02-20: qty 500

## 2018-02-20 MED ORDER — MEPERIDINE HCL 25 MG/ML IJ SOLN: 6.2500 mg | INTRAMUSCULAR | Status: DC | PRN

## 2018-02-20 MED ORDER — OXYTOCIN 40 UNITS IN LACTATED RINGERS INFUSION - SIMPLE MED: 2.5000 [IU]/h | INTRAVENOUS | Status: AC

## 2018-02-20 MED ORDER — LACTATED RINGERS IV SOLN
500.0000 mL | INTRAVENOUS | Status: DC | PRN
  Administered 2018-02-20: 21:00:00 via INTRAVENOUS

## 2018-02-20 MED ORDER — ONDANSETRON HCL 4 MG/2ML IJ SOLN
4.0000 mg | Freq: Four times a day (QID) | INTRAMUSCULAR | Status: DC | PRN
  Administered 2018-02-20: 4 mg via INTRAVENOUS

## 2018-02-20 MED ORDER — LABETALOL HCL 5 MG/ML IV SOLN
20.0000 mg | INTRAVENOUS | Status: DC | PRN
  Filled 2018-02-20: qty 4

## 2018-02-20 MED ORDER — LACTATED RINGERS IV SOLN
500.0000 mL | Freq: Once | INTRAVENOUS | Status: AC
  Administered 2018-02-20: 500 mL via INTRAVENOUS

## 2018-02-20 MED ORDER — LABETALOL HCL 5 MG/ML IV SOLN: 80.0000 mg | INTRAVENOUS | Status: DC | PRN

## 2018-02-20 MED ORDER — HYDROMORPHONE HCL 1 MG/ML IJ SOLN
INTRAMUSCULAR | Status: DC | PRN
  Administered 2018-02-20: 1 mg via INTRAVENOUS

## 2018-02-20 MED ORDER — LIDOCAINE-EPINEPHRINE (PF) 2 %-1:200000 IJ SOLN
INTRAMUSCULAR | Status: AC
  Filled 2018-02-20: qty 20

## 2018-02-20 MED ORDER — MENTHOL 3 MG MT LOZG: 1.0000 | LOZENGE | OROMUCOSAL | Status: DC | PRN

## 2018-02-20 MED ORDER — SODIUM CHLORIDE 0.9 % IR SOLN
Status: DC | PRN
  Administered 2018-02-20: 1

## 2018-02-20 MED ORDER — FLEET ENEMA 7-19 GM/118ML RE ENEM: 1.0000 | ENEMA | RECTAL | Status: DC | PRN

## 2018-02-20 MED ORDER — MAGNESIUM SULFATE 40 G IN LACTATED RINGERS - SIMPLE
2.0000 g/h | INTRAVENOUS | Status: DC
  Administered 2018-02-21: 2 g/h via INTRAVENOUS
  Filled 2018-02-20 (×2): qty 500

## 2018-02-20 MED ORDER — OXYTOCIN 10 UNIT/ML IJ SOLN
INTRAVENOUS | Status: DC | PRN
  Administered 2018-02-20: 40 [IU] via INTRAVENOUS

## 2018-02-20 MED ORDER — HYDROMORPHONE HCL 1 MG/ML IJ SOLN
INTRAMUSCULAR | Status: AC
  Filled 2018-02-20: qty 1

## 2018-02-20 MED ORDER — COCONUT OIL OIL: 1.0000 "application " | TOPICAL_OIL | Status: DC | PRN

## 2018-02-20 MED ORDER — PROMETHAZINE HCL 25 MG/ML IJ SOLN: 6.2500 mg | INTRAMUSCULAR | Status: DC | PRN

## 2018-02-20 MED ORDER — LACTATED RINGERS IV SOLN
INTRAVENOUS | Status: DC
  Administered 2018-02-21: 12:00:00 via INTRAVENOUS

## 2018-02-20 MED ORDER — SIMETHICONE 80 MG PO CHEW
80.0000 mg | CHEWABLE_TABLET | ORAL | Status: DC
  Administered 2018-02-21 – 2018-02-22 (×3): 80 mg via ORAL
  Filled 2018-02-20 (×3): qty 1

## 2018-02-20 MED ORDER — PHENYLEPHRINE 40 MCG/ML (10ML) SYRINGE FOR IV PUSH (FOR BLOOD PRESSURE SUPPORT)
80.0000 ug | PREFILLED_SYRINGE | INTRAVENOUS | Status: DC | PRN
  Filled 2018-02-20: qty 10

## 2018-02-20 MED ORDER — MAGNESIUM SULFATE 40 G IN LACTATED RINGERS - SIMPLE
2.0000 g/h | INTRAVENOUS | Status: DC
  Administered 2018-02-20: 2 g/h via INTRAVENOUS
  Filled 2018-02-20: qty 40

## 2018-02-20 MED ORDER — OXYTOCIN 40 UNITS IN LACTATED RINGERS INFUSION - SIMPLE MED
2.5000 [IU]/h | INTRAVENOUS | Status: DC
  Filled 2018-02-20: qty 1000

## 2018-02-20 MED ORDER — SIMETHICONE 80 MG PO CHEW
80.0000 mg | CHEWABLE_TABLET | Freq: Three times a day (TID) | ORAL | Status: DC
  Administered 2018-02-21 – 2018-02-23 (×7): 80 mg via ORAL
  Filled 2018-02-20 (×7): qty 1

## 2018-02-20 MED ORDER — BUTORPHANOL TARTRATE 1 MG/ML IJ SOLN
1.0000 mg | INTRAMUSCULAR | Status: DC | PRN
  Administered 2018-02-20: 1 mg via INTRAVENOUS
  Filled 2018-02-20: qty 1

## 2018-02-20 MED ORDER — FENTANYL CITRATE (PF) 100 MCG/2ML IJ SOLN: 25.0000 ug | INTRAMUSCULAR | Status: DC | PRN

## 2018-02-20 MED ORDER — LIDOCAINE HCL (PF) 1 % IJ SOLN: 30.0000 mL | INTRAMUSCULAR | Status: DC | PRN

## 2018-02-20 MED ORDER — DIBUCAINE 1 % RE OINT: 1.0000 "application " | TOPICAL_OINTMENT | RECTAL | Status: DC | PRN

## 2018-02-20 SURGICAL SUPPLY — 33 items
BARRIER ADHS 3X4 INTERCEED (GAUZE/BANDAGES/DRESSINGS) IMPLANT
BENZOIN TINCTURE PRP APPL 2/3 (GAUZE/BANDAGES/DRESSINGS) ×3 IMPLANT
CHLORAPREP W/TINT 26ML (MISCELLANEOUS) ×3 IMPLANT
CLAMP CORD UMBIL (MISCELLANEOUS) IMPLANT
CLOSURE WOUND 1/2 X4 (GAUZE/BANDAGES/DRESSINGS) ×1
CLOTH BEACON ORANGE TIMEOUT ST (SAFETY) ×3 IMPLANT
DRSG OPSITE POSTOP 4X10 (GAUZE/BANDAGES/DRESSINGS) ×3 IMPLANT
ELECT REM PT RETURN 9FT ADLT (ELECTROSURGICAL) ×3
ELECTRODE REM PT RTRN 9FT ADLT (ELECTROSURGICAL) ×1 IMPLANT
EXTRACTOR VACUUM M CUP 4 TUBE (SUCTIONS) IMPLANT
EXTRACTOR VACUUM M CUP 4' TUBE (SUCTIONS)
GLOVE BIO SURGEON STRL SZ 6.5 (GLOVE) ×2 IMPLANT
GLOVE BIO SURGEONS STRL SZ 6.5 (GLOVE) ×1
GLOVE BIOGEL PI IND STRL 7.0 (GLOVE) ×1 IMPLANT
GLOVE BIOGEL PI INDICATOR 7.0 (GLOVE) ×2
GOWN STRL REUS W/TWL LRG LVL3 (GOWN DISPOSABLE) ×6 IMPLANT
KIT ABG SYR 3ML LUER SLIP (SYRINGE) IMPLANT
NEEDLE HYPO 22GX1.5 SAFETY (NEEDLE) IMPLANT
NEEDLE HYPO 25X5/8 SAFETYGLIDE (NEEDLE) ×3 IMPLANT
NS IRRIG 1000ML POUR BTL (IV SOLUTION) ×3 IMPLANT
PACK C SECTION WH (CUSTOM PROCEDURE TRAY) ×3 IMPLANT
PAD OB MATERNITY 4.3X12.25 (PERSONAL CARE ITEMS) ×3 IMPLANT
PENCIL SMOKE EVAC W/HOLSTER (ELECTROSURGICAL) ×3 IMPLANT
STRIP CLOSURE SKIN 1/2X4 (GAUZE/BANDAGES/DRESSINGS) ×2 IMPLANT
SUT CHROMIC 0 CTX 36 (SUTURE) ×6 IMPLANT
SUT PLAIN 0 NONE (SUTURE) IMPLANT
SUT PLAIN 2 0 XLH (SUTURE) IMPLANT
SUT VIC AB 0 CT1 27 (SUTURE) ×6
SUT VIC AB 0 CT1 27XBRD ANBCTR (SUTURE) ×3 IMPLANT
SUT VIC AB 4-0 KS 27 (SUTURE) IMPLANT
SYR CONTROL 10ML LL (SYRINGE) IMPLANT
TOWEL OR 17X24 6PK STRL BLUE (TOWEL DISPOSABLE) ×3 IMPLANT
TRAY FOLEY W/BAG SLVR 14FR LF (SET/KITS/TRAYS/PACK) ×3 IMPLANT

## 2018-02-20 NOTE — Brief Op Note (Signed)
02/20/2018  9:40 PM  PATIENT:  Nancy Wong  27 y.o. female  PRE-OPERATIVE DIAGNOSIS:   IUP at 3537 w 2 days Preeclampsia Non Reassuring FHR  POST-OPERATIVE DIAGNOSIS:   Same  PROCEDURE:  Procedure(s): CESAREAN SECTION (N/A)  SURGEON:  Surgeon(s) and Role:    * Marcelle OverlieGrewal, Hobie Kohles, MD - Primary  PHYSICIAN ASSISTANT:   ASSISTANTS: none   ANESTHESIA:   epidural  EBL:  362 mL   BLOOD ADMINISTERED:none  DRAINS: Urinary Catheter (Foley)   LOCAL MEDICATIONS USED:  NONE  SPECIMEN:  No Specimen  DISPOSITION OF SPECIMEN:  N/A  COUNTS:  YES  TOURNIQUET:  * No tourniquets in log *  DICTATION: .Other Dictation: Dictation Number dictated  PLAN OF CARE: Admit to inpatient   PATIENT DISPOSITION:  PACU - hemodynamically stable.   Delay start of Pharmacological VTE agent (>24hrs) due to surgical blood loss or risk of bleeding: yes

## 2018-02-20 NOTE — Anesthesia Pain Management Evaluation Note (Signed)
  [facility] Pain Management Visit Note  Patient: Nancy Wong, 27 y.o., female  "Hello I am a member of the anesthesia team at [facility]. We have an anesthesia team available at all times to provide care throughout the hospital, including epidural management and anesthesia for C-section. I don't know your plan for the delivery whether it a natural birth, water birth, IV sedation, nitrous supplementation, doula or epidural, but we want to meet your pain goals."   1.Was your pain managed to your expectations on prior hospitalizations?   No prior hospitalizations  2.What is your expectation for pain management during this hospitalization?     Epidural  3.How can we help you reach that goal? (Epidural when appropriate)  Record the patient's initial score and the patient's pain goal.   Pain: 2  Pain Goal: 4 [facility] wants you to be able to say your pain was always managed very well.  Nancy Wong 02/20/2018

## 2018-02-20 NOTE — Progress Notes (Signed)
FHR - Repetitive late decelerations noted after epidural inserted and baseline increased to 150s Cervix only 2 cm Patient remote from vaginal delivery  Recommend proceed with Primary LTCS for Non reassuring FHR  Risks reviewed Consent signed

## 2018-02-20 NOTE — Anesthesia Postprocedure Evaluation (Signed)
Anesthesia Post Note  Patient: Nancy Wong  Procedure(s) Performed: CESAREAN SECTION (N/A )     Patient location during evaluation: PACU Anesthesia Type: Epidural Level of consciousness: awake and alert Pain management: pain level controlled Vital Signs Assessment: post-procedure vital signs reviewed and stable Respiratory status: spontaneous breathing and respiratory function stable Cardiovascular status: blood pressure returned to baseline and stable Postop Assessment: spinal receding Anesthetic complications: no    Last Vitals:  Vitals:   02/20/18 2241 02/20/18 2245  BP:  (!) 141/89  Pulse: 93 92  Resp: (!) 24 15  Temp:    SpO2: 99% 99%    Last Pain:  Vitals:   02/20/18 2241  TempSrc:   PainSc: 0-No pain   Pain Goal:                 [name]

## 2018-02-20 NOTE — Anesthesia Preprocedure Evaluation (Signed)
Anesthesia Evaluation  Patient identified by MRN, date of birth, ID band Patient awake    Reviewed: Allergy & Precautions, NPO status , Patient's Chart, lab work & pertinent test results  Airway Mallampati: II  TM Distance: >3 FB Neck ROM: Full    Dental no notable dental hx. (+) Dental Advisory Given   Pulmonary neg pulmonary ROS,    Pulmonary exam normal        Cardiovascular hypertension, Normal cardiovascular exam     Neuro/Psych Anxiety negative neurological ROS     GI/Hepatic negative GI ROS, Neg liver ROS,   Endo/Other  Hypothyroidism   Renal/GU negative Renal ROS  negative genitourinary   Musculoskeletal negative musculoskeletal ROS (+)   Abdominal   Peds negative pediatric ROS (+)  Hematology negative hematology ROS (+)   Anesthesia Other Findings   Reproductive/Obstetrics negative OB ROS eclampsia                             Anesthesia Physical Anesthesia Plan  ASA: III  Anesthesia Plan: Epidural   Post-op Pain Management:    Induction:   PONV Risk Score and Plan:   Airway Management Planned: Natural Airway  Additional Equipment:   Intra-op Plan:   Post-operative Plan:   Informed Consent: I have reviewed the patients History and Physical, chart, labs and discussed the procedure including the risks, benefits and alternatives for the proposed anesthesia with the patient or authorized representative who has indicated his/her understanding and acceptance.   Dental advisory given  Plan Discussed with: CRNA  Anesthesia Plan Comments:         Anesthesia Quick Evaluation

## 2018-02-20 NOTE — Progress Notes (Signed)
Dr Vincente PoliGrewal at bedside reviewing FHR tracing. Plan of care discussed with patient and husband about performing a C/S due to FHR tracing with late decels. Orders received to prep for C/S at this time.

## 2018-02-20 NOTE — Transfer of Care (Signed)
Immediate Anesthesia Transfer of Care Note  Patient: Nancy Wong  Procedure(s) Performed: CESAREAN SECTION (N/A )  Patient Location: PACU  Anesthesia Type:Epidural  Level of Consciousness: awake, alert  and oriented  Airway & Oxygen Therapy: Patient Spontanous Breathing  Post-op Assessment: Report given to RN and Post -op Vital signs reviewed and stable  Post vital signs: Reviewed and stable HR 78, RR 16, SaO2 97%  Last Vitals:  Vitals Value Taken Time  BP 117/72 02/20/2018 10:00 PM  Temp    Pulse 77 02/20/2018 10:13 PM  Resp 17 02/20/2018 10:13 PM  SpO2 97 % 02/20/2018 10:13 PM  Vitals shown include unvalidated device data.  Last Pain:  Vitals:   02/20/18 2155  TempSrc:   PainSc: 0-No pain         Complications: No apparent anesthesia complications

## 2018-02-20 NOTE — H&P (Signed)
Nancy Wong is a 27 y.o. female presenting for 2 stage IOL for gestational hypertension. Denies severe headache, blurry vision or epigastric pain. Pregnancy complicated by anxiety. OB History    Gravida  1   Para      Term      Preterm      AB      Living        SAB      TAB      Ectopic      Multiple      Live Births             Past Medical History:  Diagnosis Date  . Anxiety   . Hypertension   . Hypothyroidism    Past Surgical History:  Procedure Laterality Date  . WISDOM TOOTH EXTRACTION     Family History: family history includes Cancer in her mother; Diabetes in her maternal grandfather and paternal grandfather; Healthy in her brother; Heart attack in her paternal grandfather; Heart disease in her maternal grandfather and paternal grandfather; Hypertension in her father; Kidney disease in her father. Social History:  reports that she has never smoked. She has never used smokeless tobacco. She reports that she does not drink alcohol or use drugs.     Maternal Diabetes: No Genetic Screening: Normal Maternal Ultrasounds/Referrals: Normal Fetal Ultrasounds or other Referrals:  None Maternal Substance Abuse:  No Significant Maternal Medications:  None Significant Maternal Lab Results:  None Other Comments:  None  Review of Systems  Eyes: Negative for blurred vision.  Gastrointestinal: Negative for abdominal pain.  Neurological: Negative for headaches.   Maternal Medical History:  Fetal activity: Perceived fetal activity is normal.      Dilation: Closed Effacement (%): 50 Station: -1 Exam by:: Nancy Bers, RN Blood pressure (!) 157/102, pulse 90, temperature 98.5 F (36.9 C), temperature source Oral, resp. rate 16, height 5\' 2"  (1.575 m), weight 98 kg (216 lb), last menstrual period 06/04/2017, SpO2 100 %.   Fetal Exam Fetal State Assessment: Category I - tracings are normal.     Physical Exam  Cardiovascular: Normal rate.   Respiratory: Effort normal.  GI: Soft.  Neurological: She has normal reflexes.    cytotec in place Magnesium sulfate infusing  Prenatal labs: ABO, Rh: --/--/A POS, A POS Performed at [facility], [address] [place], [place] [address]  [phone]) Antibody: NEG (08/05 0101) Rubella: Immune (01/16 0000) RPR: Nonreactive (01/16 0000)  HBsAg: Negative (01/16 0000)  HIV: Non-reactive (01/16 0000)  GBS: Negative (07/22 0000)   Results for orders placed or performed during the hospital encounter of 02/20/18 (from the past 24 hour(s))  CBC     Status: Abnormal   Collection Time: 02/20/18  1:01 AM  Result Value Ref Range   WBC 10.2 4.0 - 10.5 K/uL   RBC 3.81 (L) 3.87 - 5.11 MIL/uL   Hemoglobin 11.5 (L) 12.0 - 15.0 g/dL   HCT 11.9 (L) 14.7 - 82.9 %   MCV 87.9 78.0 - 100.0 fL   MCH 30.2 26.0 - 34.0 pg   MCHC 34.3 30.0 - 36.0 g/dL   RDW 56.2 13.0 - 86.5 %   Platelets 213 150 - 400 K/uL  Type and screen [facility]     Status: None   Collection Time: 02/20/18  1:01 AM  Result Value Ref Range   ABO/RH(D) A POS    Antibody Screen NEG    Sample Expiration      02/23/2018 Performed  at [facility], [address] [place], [place] [address]   Comprehensive metabolic panel     Status: Abnormal   Collection Time: 02/20/18  1:01 AM  Result Value Ref Range   Sodium 134 (L) 135 - 145 mmol/L   Potassium 4.0 3.5 - 5.1 mmol/L   Chloride 105 98 - 111 mmol/L   CO2 18 (L) 22 - 32 mmol/L   Glucose, Bld 93 70 - 99 mg/dL   BUN 19 6 - 20 mg/dL   Creatinine, Ser 9.560.93 0.44 - 1.00 mg/dL   Calcium 9.3 8.9 - 21.310.3 mg/dL   Total Protein 6.2 (L) 6.5 - 8.1 g/dL   Albumin 3.0 (L) 3.5 - 5.0 g/dL   AST 19 15 - 41 U/L   ALT 18 0 - 44 U/L   Alkaline Phosphatase 146 (H) 38 - 126 U/L   Total Bilirubin 0.4 0.3 - 1.2 mg/dL   GFR calc non Af Amer >60 >60 mL/min   GFR calc Af Amer >60 >60 mL/min   Anion gap 11 5 - 15  ABO/Rh     Status: None   Collection Time: 02/20/18   1:01 AM  Result Value Ref Range   ABO/RH(D)      A POS Performed at [facility], [address] [place], [place] [address]     Assessment/Plan: 27 yo G1P0  Preeclampsia Labetalol for severe range BP per protocol D/W patient above  Roselle LocusJames E Jocabed Cheese II 02/20/2018, 6:27 AM

## 2018-02-20 NOTE — Progress Notes (Signed)
Patient is now status post 4 cytotec FHR Category 1 BP 127/67   Pulse 95   Temp 98.7 F (37.1 C)   Resp 18   Ht 5\' 2"  (1.575 m)   Wt 98 kg (216 lb)   LMP 06/04/2017   SpO2 99%   BMI 39.51 kg/m  Cervix is 60% 2 cm -2 Vertex Posterior AROM Clear fluid  IUP at 37 w 2 days IOL Gestational Hypertension on Magnesium  Start pitocin later if UCs do not space Recommend epidural

## 2018-02-20 NOTE — Progress Notes (Signed)
Patient doing well.   BP (!) 147/87 (BP Location: Left Arm)   Pulse 86   Temp 98.5 F (36.9 C) (Oral)   Resp 18   Ht 5\' 2"  (1.575 m)   Wt 98 kg (216 lb)   LMP 06/04/2017   SpO2 99%   BMI 39.51 kg/m  FHR Category 1  Cervix patient very hard to check  Long / closed/posterior  IUP at 37 w 2 days IOL Gestational hypertension  - status post 2 cytotec recommend at lest 2 more cytotec - continue Magnesium and follow labs and BPs closely  - discussed cervical ripening procedure with patient and all of her questions are answered

## 2018-02-21 ENCOUNTER — Encounter (HOSPITAL_COMMUNITY): Payer: Self-pay | Admitting: Obstetrics and Gynecology

## 2018-02-21 LAB — CBC
HCT: 33.1 % — ABNORMAL LOW (ref 36.0–46.0)
HEMOGLOBIN: 11.3 g/dL — AB (ref 12.0–15.0)
MCH: 30.1 pg (ref 26.0–34.0)
MCHC: 34.1 g/dL (ref 30.0–36.0)
MCV: 88 fL (ref 78.0–100.0)
Platelets: 187 10*3/uL (ref 150–400)
RBC: 3.76 MIL/uL — AB (ref 3.87–5.11)
RDW: 13.9 % (ref 11.5–15.5)
WBC: 13.6 10*3/uL — ABNORMAL HIGH (ref 4.0–10.5)

## 2018-02-21 LAB — COMPREHENSIVE METABOLIC PANEL
ALBUMIN: 2.9 g/dL — AB (ref 3.5–5.0)
ALK PHOS: 128 U/L — AB (ref 38–126)
ALT: 16 U/L (ref 0–44)
ANION GAP: 11 (ref 5–15)
AST: 25 U/L (ref 15–41)
BUN: 13 mg/dL (ref 6–20)
CALCIUM: 7.3 mg/dL — AB (ref 8.9–10.3)
CO2: 19 mmol/L — AB (ref 22–32)
CREATININE: 1 mg/dL (ref 0.44–1.00)
Chloride: 103 mmol/L (ref 98–111)
GFR calc non Af Amer: >60 mL/min (ref >60)
GLUCOSE: 113 mg/dL — AB (ref 70–99)
Potassium: 4.1 mmol/L (ref 3.5–5.1)
SODIUM: 133 mmol/L — AB (ref 135–145)
Total Bilirubin: 0.1 mg/dL — ABNORMAL LOW (ref 0.3–1.2)
Total Protein: 6.3 g/dL — ABNORMAL LOW (ref 6.5–8.1)

## 2018-02-21 MED ORDER — LABETALOL HCL 200 MG PO TABS
200.0000 mg | ORAL_TABLET | Freq: Two times a day (BID) | ORAL | Status: DC
  Administered 2018-02-21 – 2018-02-23 (×6): 200 mg via ORAL
  Filled 2018-02-21 (×6): qty 1

## 2018-02-21 MED ORDER — CALCIUM CARBONATE ANTACID 500 MG PO CHEW
1.0000 | CHEWABLE_TABLET | ORAL | Status: DC | PRN
Start: 2018-02-21 — End: 2018-02-23
  Administered 2018-02-21: 400 mg via ORAL
  Filled 2018-02-21: qty 2

## 2018-02-21 NOTE — Progress Notes (Signed)
CSW attempted to meet with MOB via bedside- CSW was notified by RN patient was not wanting to have visitors at this time. CSW noted patient is currently on magnesium.   CSW follow up at another time once MOB is feeling better.   Stacy GardnerErin Braniya Farrugia, LCSW Clinical Social Worker  [phone]

## 2018-02-21 NOTE — Lactation Note (Signed)
This note was copied from a baby'Wong chart. Lactation Consultation Note  Patient Name: Nancy Wong ZOXWR'UToday'Wong Date: 02/21/2018 Reason for consult: Initial assessment;Infant < 6lbs;Primapara;Late-preterm 34-36.6wks Caring For Your Late Preterm Baby handout given and reviewed.  Baby sleeping.  Undressed and waking techniques done.  Baby placed skin to skin in football hold.  Hand expression done but no colostrum seen.  Baby sleepy at breast and not opening mouth.  Nipple flat and breast tissue edematous.  16 mm nipple shield applied.  Baby held shield in her mouth but no sucking elicited.  Assisted mom with paced bottle feeding.  Baby does not have a good seal on nipple.  She took 7 mls of neosure.  Assisted mom with pumping.  Instructed to put baby to breast using shield followed by supplementation with expressed milk/neosure.  Mom will pump every 3 hours.  Encouraged to call for concerns/assist prn.  Maternal Data    Feeding Feeding Type: Breast Fed  LATCH Score Latch: Too sleepy or reluctant, no latch achieved, no sucking elicited.  Audible Swallowing: None  Type of Nipple: Flat  Comfort (Breast/Nipple): Soft / non-tender  Hold (Positioning): Assistance needed to correctly position infant at breast and maintain latch.  LATCH Score: 4  Interventions Interventions: Assisted with latch;Breast compression;Skin to skin;Adjust position;Breast massage;Support pillows;Hand express;Position options;DEBP  Lactation Tools Discussed/Used Tools: Nipple Shields Nipple shield size: 16 Pump Review: Setup, frequency, and cleaning Initiated by:: RN Date initiated:: 02/20/18   Consult Status Consult Status: Follow-up Date: 02/22/18 Follow-up type: In-patient    Nancy Wong, Nancy Wong 02/21/2018, 7:01 PM

## 2018-02-21 NOTE — Op Note (Signed)
NAMLinus Galas: Freedman, Genee MEDICAL RECORD [id number] ACCOUNT [id number] DATE OF BIRTH:[birth year] FACILITY: [facility] LOCATION: [id number] PHYSICIAN:Kule Gascoigne Rosita FireL. Conleigh Heinlein, MD  OPERATIVE REPORT  DATE OF PROCEDURE:  02/20/2018  PREOPERATIVE DIAGNOSES:   1.  Intrauterine pregnancy at 37 weeks and 2 days. 2.  Preeclampsia and nonreassuring fetal heart rate.  POSTOPERATIVE DIAGNOSES:   1.  Intrauterine pregnancy at 37 weeks and 2 days. 2.  Preeclampsia and nonreassuring fetal heart rate.  PROCEDURE PERFORMED:  Primary low transverse cesarean section.  SURGEON:  Marcelle OverlieMichelle Mariyanna Mucha, MD  ESTIMATED BLOOD LOSS:  300 mL.  COMPLICATIONS:  None.  DRAINS:  Foley.  DESCRIPTION OF PROCEDURE:  The patient was taken to the operating room.  Her epidural had been dosed.  She was prepped and draped.  A low transverse incision was made and carried down to the fascia.  Fascia was scored in the midline and extended  laterally.  The rectus muscles were separated in the midline.  The peritoneum was entered bluntly.  The peritoneal incision was then stretched.  The lower uterine segment was identified and the bladder blade was inserted.  The bladder flap was developed  with sharp and blunt dissection.  A low transverse incision was made in the uterus.  The uterus was then entered using a hemostat.  The baby was delivered easily.  The cord was clamped and cut.  The baby was vigorous with Apgars 9 at 1 minute and 9 at 5  minutes.  The NICU team was available.  The placenta was manually removed, noted to be normal and intact with a 3-vessel cord.  The uterus was exteriorized and cleared of all clots and debris.  The uterine incision was closed in 1 layer using 0 chromic  in a running locked stitch.  Irrigation was performed.  The uterus was returned to the abdomen.  Hemostasis was very good.  The peritoneum was closed using 0 Vicryl.  The fascia was closed using 0 Vicryl starting in each corner meeting in the midline.   After  irrigation of subcutaneous layer, the skin was closed with 3-0 Vicryl on a Keith needle.  Steri-Strips were applied.  A pressure dressing was applied.  The patient tolerated the procedure well and went to recovery room in stable condition.  All  sponge, lap and instrument counts were correct x2.  TN/NUANCE  D:02/21/2018 T:02/21/2018 JOB:[id number]

## 2018-02-21 NOTE — Progress Notes (Signed)
RN and NT attempted to ambulate patient to bathroom for foley removal.  Patient sat, stood and ambulated 2 feet.  Patient felt weak and looked pale.  Pt assisted to bed.   Update given to Dr. Renaldo FiddlerAdkins- RN to attempt ambulation and foley removal at time of magnesium d/c this evening.

## 2018-02-21 NOTE — Progress Notes (Signed)
Subjective: Postpartum Day 1: Cesarean Delivery Patient reports nausea and incisional pain.    Objective: Vital signs in last 24 hours: Temp:  [97.6 F (36.4 C)-99.3 F (37.4 C)] 97.8 F (36.6 C) (08/06 0840) Pulse Rate:  [77-115] 79 (08/06 0747) Resp:  [15-24] 18 (08/06 0747) BP: (89-173)/(44-104) 134/80 (08/06 0747) SpO2:  [97 %-100 %] 98 % (08/06 0747)  Physical Exam:  General: alert and cooperative Lochia: appropriate Uterine Fundus: firm Incision: healing well, no significant drainage DVT Evaluation: No evidence of DVT seen on physical exam.  Recent Labs    02/20/18 2234 02/21/18 0551  HGB 10.9* 11.3*  HCT 31.9* 33.1*    Assessment/Plan: Status post Cesarean section. Pre-eclampsia - continue mag x 24 hrs  Nancy Wong 02/21/2018, 8:53 AM

## 2018-02-21 NOTE — Anesthesia Postprocedure Evaluation (Signed)
Anesthesia Post Note  Patient: Nancy Wong  Procedure(s) Performed: CESAREAN SECTION (N/A )     Patient location during evaluation: [facility] Anesthesia Type: Epidural Level of consciousness: awake and alert Pain management: pain level controlled Vital Signs Assessment: post-procedure vital signs reviewed and stable Respiratory status: spontaneous breathing, nonlabored ventilation and respiratory function stable Cardiovascular status: stable Postop Assessment: no headache, no backache, epidural receding, patient able to bend at knees, no apparent nausea or vomiting and adequate PO intake Anesthetic complications: no    Last Vitals:  Vitals:   02/21/18 0747 02/21/18 1152  BP: 134/80 118/79  Pulse: 79 77  Resp: 18 18  Temp: 37.1 C 36.6 C  SpO2: 98% 95%    Last Pain:  Vitals:   02/21/18 1015  TempSrc:   PainSc: 4    Pain Goal: Patients Stated Pain Goal: 3 (02/21/18 0032)               Blythe Stanfordavid Adedayo Marquell Saenz

## 2018-02-21 NOTE — Plan of Care (Signed)
  Problem: Education: Goal: Knowledge of condition will improve Outcome: Progressing   

## 2018-02-21 NOTE — Anesthesia Postprocedure Evaluation (Signed)
Anesthesia Post Note  Patient: Nancy Wong  Procedure(s) Performed: CESAREAN SECTION (N/A )     Patient location during evaluation: Mother Baby Anesthesia Type: Epidural Level of consciousness: awake Pain management: pain level controlled Vital Signs Assessment: post-procedure vital signs reviewed and stable Respiratory status: spontaneous breathing Cardiovascular status: stable Postop Assessment: no headache, no backache and epidural receding Anesthetic complications: no    Last Vitals:  Vitals:   02/21/18 0700 02/21/18 0747  BP:  134/80  Pulse:  79  Resp: 16 18  Temp:  37.1 C  SpO2:  98%    Last Pain:  Vitals:   02/21/18 0750  TempSrc:   PainSc: 3    Pain Goal: Patients Stated Pain Goal: 3 (02/21/18 0032)               Edison PaceWILKERSON,Reinette Cuneo

## 2018-02-21 NOTE — Addendum Note (Signed)
Addendum  created 02/21/18 1244 by Lenox AhrAdeloye, Legacy Carrender A, CRNA   Sign clinical note

## 2018-02-21 NOTE — Addendum Note (Signed)
Addendum  created 02/21/18 0959 by Earmon PhoenixWilkerson, Zuhair Lariccia P, CRNA   Charge Capture section accepted, Sign clinical note

## 2018-02-22 NOTE — Progress Notes (Signed)
Subjective: Postpartum Day 1: Cesarean Delivery Patient reports tolerating PO, + flatus and no problems voiding.    Objective: Vital signs in last 24 hours: Temp:  [97.5 F (36.4 C)-98.2 F (36.8 C)] 98.1 F (36.7 C) (08/07 0901) Pulse Rate:  [70-82] 70 (08/07 0901) Resp:  [16-18] 18 (08/07 0901) BP: (111-148)/(74-90) 148/90 (08/07 0901) SpO2:  [95 %-100 %] 100 % (08/07 0901)  Physical Exam:  General: alert, cooperative and appears stated age Lochia: appropriate Uterine Fundus: firm Incision: healing well, no significant drainage, no dehiscence, no significant erythema DVT Evaluation: No evidence of DVT seen on physical exam. Negative Homan's sign. No cords or calf tenderness. No significant calf/ankle edema.  Recent Labs    02/20/18 2234 02/21/18 0551  HGB 10.9* 11.3*  HCT 31.9* 33.1*    Assessment/Plan: Status post Cesarean section. Doing well postoperatively.  S/p Mag x 24 hours with stable Bps now. Baby girl doing well.    Nancy Wong Nancy Wong 02/22/2018, 11:44 AM

## 2018-02-22 NOTE — Lactation Note (Signed)
This note was copied from a baby's chart. Lactation Consultation Note  Patient Name: Nancy Wong ZOXWR'UToday's Date: 02/22/2018 Reason for consult: Follow-up assessment;Early term 37-38.6wks;Infant < 6lbs Baby is formula feeding by bottle.  Mom attempted breast this morning but baby not interested.  She is not pumping consistently.  Stressed importance of pumping every 3 hours to establish a milk supply.  Mom would like assist with breastfeeding next feeding.  Will follow up.  Maternal Data    Feeding Feeding Type: Formula Nipple Type: Regular  LATCH Score                   Interventions    Lactation Tools Discussed/Used     Consult Status Consult Status: Follow-up Date: 02/23/18 Follow-up type: In-patient    [name], [name] 02/22/2018, 2:36 PM

## 2018-02-22 NOTE — Lactation Note (Signed)
This note was copied from a baby'Wong chart. Lactation Consultation Note  Patient Name: Nancy Wong'UToday'Wong Date: 02/22/2018 Reason for consult: Follow-up assessment;Infant < 6lbs;Early term 37-38.6wks Assisted with skin to skin in football hold.  16 mm nipple shield applied.  Baby latched well and fed actively for 20 minutes.  Colostrum in shield when baby came off.  Assisted mom with pumping.  MGM will supplement baby with formula.  Maternal Data    Feeding Feeding Type: Breast Fed Nipple Type: Regular Length of feed: 20 min  LATCH Score Latch: Grasps breast easily, tongue down, lips flanged, rhythmical sucking.  Audible Swallowing: A few with stimulation  Type of Nipple: Flat  Comfort (Breast/Nipple): Soft / non-tender  Hold (Positioning): Assistance needed to correctly position infant at breast and maintain latch.  LATCH Score: 7  Interventions Interventions: Assisted with latch;Breast compression;Skin to skin;Adjust position;Breast massage;Support pillows;Hand express  Lactation Tools Discussed/Used Tools: Nipple Shields Nipple shield size: 16   Consult Status Consult Status: Follow-up Date: 02/23/18 Follow-up type: In-patient    [name], Nancy Wong 02/22/2018, 3:18 PM

## 2018-02-23 MED ORDER — LABETALOL HCL 200 MG PO TABS: 300.0000 mg | ORAL_TABLET | Freq: Two times a day (BID) | ORAL | 1 refills | Status: DC

## 2018-02-23 MED ORDER — OXYCODONE-ACETAMINOPHEN 5-325 MG PO TABS: 1.0000 | ORAL_TABLET | Freq: Four times a day (QID) | ORAL | 0 refills | Status: DC | PRN

## 2018-02-23 NOTE — Progress Notes (Signed)
Patient discharged home with infant and family. Follow-up care reviewed; Medications discussed; Discharge instructions reviewed; Admission discussed; Prescriptions reviewed; Pain management discussed; Educated about hypertension in pregnancy. Pt verbalized understanding.

## 2018-02-23 NOTE — Lactation Note (Signed)
This note was copied from a baby's chart. Lactation Consultation Note  Patient Name: Girl Linus GalasKendall Lukas UEAVW'UToday's Date: 02/23/2018 Reason for consult: Follow-up assessment  Visited with P1 Mom of ET baby at 9062 hrs old, on day of discharge.  Baby had just had a bottle, Mom aware of volume parameters for feeding baby.  Mom hasn't been consistently double pumping, and baby has predominantly been getting bottles of formula.    Encouraged keeping baby STS as much as possible, and feeding baby every 3 hrs.  Mom using nipple shield, and baby last latched with LC yesterday.  Recommended offering breast first, then supplementing with formula+/EBM per volume parameters.  Mom encouraged to pump at each feeding for 15-20 mins, adding breast massage and hand expression to obtain more EBM.    Engorgement prevention and treatment reviewed. [facility], but aware of our OP lactation services available to her.   Interventions Interventions: Breast feeding basics reviewed;Skin to skin;Breast massage;Hand express;DEBP;Expressed milk  Lactation Tools Discussed/Used Tools: Pump;Nipple Dorris CarnesShields;Bottle Nipple shield size: 16 Breast pump type: Double-Electric Breast Pump   Consult Status Consult Status: Complete Date: 02/24/18 Follow-up type: Call as needed    Judee ClaraSmith, Issabella Rix E 02/23/2018, 12:11 PM

## 2018-02-23 NOTE — Discharge Summary (Signed)
Obstetric Discharge Summary Reason for Admission: induction of labor Prenatal Procedures: none Intrapartum Procedures: cesarean: low cervical, transverse Postpartum Procedures: none Complications-Operative and Postpartum: none Hemoglobin  Date Value Ref Range Status  02/21/2018 11.3 (L) 12.0 - 15.0 g/dL Final   HCT  Date Value Ref Range Status  02/21/2018 33.1 (L) 36.0 - 46.0 % Final    Physical Exam:  General: alert, cooperative and no distress Lochia: appropriate Uterine Fundus: firm Incision: healing well DVT Evaluation: No evidence of DVT seen on physical exam.  Discharge Diagnoses: Term Pregnancy-delivered  Discharge Information: Date: 02/23/2018 Activity: pelvic rest Diet: routine Medications: PNV, Ibuprofen, Percocet and labetalol 300mg  bid Condition: stable Instructions: refer to practice specific booklet Discharge to: home   Newborn Data: Live born female  Birth Weight: 5 lb 8 oz (2495 g) APGAR: 8, 9  Newborn Delivery   Birth date/time:  02/20/2018 21:15:00 Delivery type:  C-Section, Low Vertical Trial of labor:  Yes C-section categorization:  Primary     Home with mother.  Nancy Wong 02/23/2018, 9:41 AM

## 2018-03-11 ENCOUNTER — Inpatient Hospital Stay (HOSPITAL_COMMUNITY)
Admission: AD | Admit: 2018-03-11 | Payer: BLUE CROSS/BLUE SHIELD | Source: Ambulatory Visit | Admitting: Obstetrics and Gynecology

## 2018-04-03 DIAGNOSIS — Z1389 Encounter for screening for other disorder: Secondary | ICD-10-CM | POA: Diagnosis not present

## 2018-05-01 DIAGNOSIS — Z3009 Encounter for other general counseling and advice on contraception: Secondary | ICD-10-CM | POA: Diagnosis not present

## 2018-05-30 ENCOUNTER — Encounter (HOSPITAL_COMMUNITY): Payer: Self-pay

## 2018-06-19 ENCOUNTER — Telehealth: Payer: Self-pay | Admitting: Family Medicine

## 2018-06-19 NOTE — Telephone Encounter (Signed)
Called and scheduled pt for an appt on 12/3 at 8am for CPE.

## 2018-06-19 NOTE — Telephone Encounter (Signed)
Copied from CRM [phone]. Topic: Quick Communication - See Telephone Encounter >> Jun 19, 2018 10:09 AM Jens SomMedley, Jennifer A wrote: CRM for notification. See Telephone encounter for: 06/19/18.  Patient is calling because she requesting an order to be placed for a biometric screening to be placed before Thursday for insurance purpose. Call back number [phone]

## 2018-06-20 ENCOUNTER — Encounter: Payer: Self-pay | Admitting: Family Medicine

## 2018-06-20 ENCOUNTER — Other Ambulatory Visit: Payer: Self-pay

## 2018-06-20 ENCOUNTER — Ambulatory Visit (INDEPENDENT_AMBULATORY_CARE_PROVIDER_SITE_OTHER): Payer: BLUE CROSS/BLUE SHIELD | Admitting: Family Medicine

## 2018-06-20 VITALS — BP 121/80 | HR 76 | Temp 98.0°F | Resp 16 | Ht 64.0 in | Wt 200.4 lb

## 2018-06-20 DIAGNOSIS — Z23 Encounter for immunization: Secondary | ICD-10-CM | POA: Diagnosis not present

## 2018-06-20 DIAGNOSIS — Z0000 Encounter for general adult medical examination without abnormal findings: Secondary | ICD-10-CM | POA: Insufficient documentation

## 2018-06-20 DIAGNOSIS — E669 Obesity, unspecified: Secondary | ICD-10-CM

## 2018-06-20 LAB — BASIC METABOLIC PANEL
BUN: 22 mg/dL (ref 6–23)
CHLORIDE: 103 meq/L (ref 96–112)
CO2: 28 mEq/L (ref 19–32)
Calcium: 10 mg/dL (ref 8.4–10.5)
Creatinine, Ser: 1.16 mg/dL (ref 0.40–1.20)
GFR: 58.73 mL/min — AB (ref >60.00)
Glucose, Bld: 102 mg/dL — ABNORMAL HIGH (ref 70–99)
POTASSIUM: 3.9 meq/L (ref 3.5–5.1)
SODIUM: 139 meq/L (ref 135–145)

## 2018-06-20 LAB — LIPID PANEL
CHOL/HDL RATIO: 4
Cholesterol: 176 mg/dL (ref 0–200)
HDL: 46.8 mg/dL (ref >39.00)
LDL CALC: 99 mg/dL (ref 0–99)
NONHDL: 129.66
TRIGLYCERIDES: 151 mg/dL — AB (ref 0.0–149.0)
VLDL: 30.2 mg/dL (ref 0.0–40.0)

## 2018-06-20 LAB — HEPATIC FUNCTION PANEL
ALBUMIN: 4.7 g/dL (ref 3.5–5.2)
ALK PHOS: 86 U/L (ref 39–117)
ALT: 25 U/L (ref 0–35)
AST: 17 U/L (ref 0–37)
BILIRUBIN TOTAL: 0.4 mg/dL (ref 0.2–1.2)
Bilirubin, Direct: 0.1 mg/dL (ref 0.0–0.3)
Total Protein: 7.6 g/dL (ref 6.0–8.3)

## 2018-06-20 LAB — CBC WITH DIFFERENTIAL/PLATELET
BASOS PCT: 0.6 % (ref 0.0–3.0)
Basophils Absolute: 0 10*3/uL (ref 0.0–0.1)
Eosinophils Absolute: 0.2 10*3/uL (ref 0.0–0.7)
Eosinophils Relative: 2.5 % (ref 0.0–5.0)
HEMATOCRIT: 38 % (ref 36.0–46.0)
HEMOGLOBIN: 12.9 g/dL (ref 12.0–15.0)
LYMPHS ABS: 2.3 10*3/uL (ref 0.7–4.0)
Lymphocytes Relative: 32.5 % (ref 12.0–46.0)
MCHC: 34 g/dL (ref 30.0–36.0)
MCV: 82.6 fl (ref 78.0–100.0)
MONOS PCT: 5.9 % (ref 3.0–12.0)
Monocytes Absolute: 0.4 10*3/uL (ref 0.1–1.0)
Neutro Abs: 4.1 10*3/uL (ref 1.4–7.7)
Neutrophils Relative %: 58.5 % (ref 43.0–77.0)
PLATELETS: 258 10*3/uL (ref 150.0–400.0)
RBC: 4.6 Mil/uL (ref 3.87–5.11)
RDW: 13.1 % (ref 11.5–15.5)
WBC: 7 10*3/uL (ref 4.0–10.5)

## 2018-06-20 LAB — TSH: TSH: 1.33 u[IU]/mL (ref 0.35–4.50)

## 2018-06-20 NOTE — Patient Instructions (Addendum)
Follow up in 1 year or as needed We'll notify you of your lab results and make any changes if needed Keep up the good work on healthy diet and regular exercise- you're doing great! Call with any questions or concerns Happy Holidays! CONGRATS!!!

## 2018-06-20 NOTE — Progress Notes (Signed)
   Subjective:    Patient ID: Nancy Wong, female    DOB: [birth year], 27 y.o.   MRN: [id number]  HPI CPE- pt had baby on 02/20/18 which accounts for the 25 lb weight gain.  Pt is working on weight loss.  UTD on pap, Tdap.     Review of Systems Patient reports no vision/ hearing changes, adenopathy,fever, weight change,  persistant/recurrent hoarseness , swallowing issues, chest pain, palpitations, edema, persistant/recurrent cough, hemoptysis, dyspnea (rest/exertional/paroxysmal nocturnal), gastrointestinal bleeding (melena, rectal bleeding), abdominal pain, significant heartburn, bowel changes, GU symptoms (dysuria, hematuria, incontinence), Gyn symptoms (abnormal  bleeding, pain),  syncope, focal weakness, memory loss, numbness & tingling, skin/hair/nail changes, abnormal bruising or bleeding, anxiety, or depression.     Objective:   Physical Exam General Appearance:    Alert, cooperative, no distress, appears stated age, obese  Head:    Normocephalic, without obvious abnormality, atraumatic  Eyes:    PERRL, conjunctiva/corneas clear, EOM's intact, fundi    benign, both eyes  Ears:    Normal TM's and external ear canals, both ears  Nose:   Nares normal, septum midline, mucosa normal, no drainage    or sinus tenderness  Throat:   Lips, mucosa, and tongue normal; teeth and gums normal  Neck:   Supple, symmetrical, trachea midline, no adenopathy;    Thyroid: no enlargement/tenderness/nodules  Back:     Symmetric, no curvature, ROM normal, no CVA tenderness  Lungs:     Clear to auscultation bilaterally, respirations unlabored  Chest Wall:    No tenderness or deformity   Heart:    Regular rate and rhythm, S1 and S2 normal, no murmur, rub   or gallop  Breast Exam:    Deferred to GYN  Abdomen:     Soft, non-tender, bowel sounds active all four quadrants,    no masses, no organomegaly  Genitalia:    Deferred to GYN  Rectal:    Extremities:   Extremities normal, atraumatic, no cyanosis or  edema  Pulses:   2+ and symmetric all extremities  Skin:   Skin color, texture, turgor normal, no rashes or lesions  Lymph nodes:   Cervical, supraclavicular, and axillary nodes normal  Neurologic:   CNII-XII intact, normal strength, sensation and reflexes    throughout          Assessment & Plan:

## 2018-06-20 NOTE — Assessment & Plan Note (Signed)
Pt's PE WNL w/ exception of obesity- which she is working on.  UTD on Tdap, flu given today.  Check labs.  Anticipatory guidance provided.

## 2018-06-20 NOTE — Assessment & Plan Note (Signed)
Pt's 25 lb weight gain is attributed to her recent pregnancy.  Discussed diet and exercise.  Check labs to risk stratify.  Will follow.

## 2019-04-19 DIAGNOSIS — E039 Hypothyroidism, unspecified: Secondary | ICD-10-CM | POA: Diagnosis not present

## 2019-04-19 DIAGNOSIS — Z6830 Body mass index (BMI) 30.0-30.9, adult: Secondary | ICD-10-CM | POA: Diagnosis not present

## 2019-04-19 DIAGNOSIS — Z01419 Encounter for gynecological examination (general) (routine) without abnormal findings: Secondary | ICD-10-CM | POA: Diagnosis not present

## 2019-04-19 DIAGNOSIS — N941 Unspecified dyspareunia: Secondary | ICD-10-CM | POA: Diagnosis not present

## 2019-06-06 DIAGNOSIS — N941 Unspecified dyspareunia: Secondary | ICD-10-CM | POA: Diagnosis not present

## 2019-06-06 DIAGNOSIS — N911 Secondary amenorrhea: Secondary | ICD-10-CM | POA: Diagnosis not present

## 2019-06-06 DIAGNOSIS — G4700 Insomnia, unspecified: Secondary | ICD-10-CM | POA: Diagnosis not present

## 2019-06-06 DIAGNOSIS — F418 Other specified anxiety disorders: Secondary | ICD-10-CM | POA: Diagnosis not present

## 2019-06-13 DIAGNOSIS — E039 Hypothyroidism, unspecified: Secondary | ICD-10-CM | POA: Diagnosis not present

## 2019-06-13 DIAGNOSIS — N911 Secondary amenorrhea: Secondary | ICD-10-CM | POA: Diagnosis not present

## 2019-06-13 DIAGNOSIS — N941 Unspecified dyspareunia: Secondary | ICD-10-CM | POA: Diagnosis not present

## 2019-06-13 DIAGNOSIS — F418 Other specified anxiety disorders: Secondary | ICD-10-CM | POA: Diagnosis not present

## 2019-06-19 DIAGNOSIS — Z3481 Encounter for supervision of other normal pregnancy, first trimester: Secondary | ICD-10-CM | POA: Diagnosis not present

## 2019-06-19 DIAGNOSIS — Z3685 Encounter for antenatal screening for Streptococcus B: Secondary | ICD-10-CM | POA: Diagnosis not present

## 2019-06-26 ENCOUNTER — Encounter: Payer: BLUE CROSS/BLUE SHIELD | Admitting: Family Medicine

## 2019-07-05 DIAGNOSIS — Z113 Encounter for screening for infections with a predominantly sexual mode of transmission: Secondary | ICD-10-CM | POA: Diagnosis not present

## 2019-07-05 DIAGNOSIS — Z3491 Encounter for supervision of normal pregnancy, unspecified, first trimester: Secondary | ICD-10-CM | POA: Diagnosis not present

## 2019-07-24 DIAGNOSIS — Z315 Encounter for genetic counseling: Secondary | ICD-10-CM | POA: Diagnosis not present

## 2019-07-24 DIAGNOSIS — Z3481 Encounter for supervision of other normal pregnancy, first trimester: Secondary | ICD-10-CM | POA: Diagnosis not present

## 2019-07-24 DIAGNOSIS — Z3682 Encounter for antenatal screening for nuchal translucency: Secondary | ICD-10-CM | POA: Diagnosis not present

## 2019-07-24 DIAGNOSIS — Z3A12 12 weeks gestation of pregnancy: Secondary | ICD-10-CM | POA: Diagnosis not present

## 2019-08-15 DIAGNOSIS — O351XX0 Maternal care for (suspected) chromosomal abnormality in fetus, not applicable or unspecified: Secondary | ICD-10-CM | POA: Diagnosis not present

## 2019-08-15 DIAGNOSIS — Z3A16 16 weeks gestation of pregnancy: Secondary | ICD-10-CM | POA: Diagnosis not present

## 2019-09-12 DIAGNOSIS — Z3A20 20 weeks gestation of pregnancy: Secondary | ICD-10-CM | POA: Diagnosis not present

## 2019-09-12 DIAGNOSIS — Z363 Encounter for antenatal screening for malformations: Secondary | ICD-10-CM | POA: Diagnosis not present

## 2019-10-09 DIAGNOSIS — Z362 Encounter for other antenatal screening follow-up: Secondary | ICD-10-CM | POA: Diagnosis not present

## 2019-10-09 DIAGNOSIS — Z3480 Encounter for supervision of other normal pregnancy, unspecified trimester: Secondary | ICD-10-CM | POA: Diagnosis not present

## 2019-10-09 DIAGNOSIS — Z3A23 23 weeks gestation of pregnancy: Secondary | ICD-10-CM | POA: Diagnosis not present

## 2019-11-06 DIAGNOSIS — Z23 Encounter for immunization: Secondary | ICD-10-CM | POA: Diagnosis not present

## 2019-11-06 DIAGNOSIS — Z3480 Encounter for supervision of other normal pregnancy, unspecified trimester: Secondary | ICD-10-CM | POA: Diagnosis not present

## 2019-11-22 DIAGNOSIS — O26849 Uterine size-date discrepancy, unspecified trimester: Secondary | ICD-10-CM | POA: Diagnosis not present

## 2019-11-22 DIAGNOSIS — Z3A31 31 weeks gestation of pregnancy: Secondary | ICD-10-CM | POA: Diagnosis not present

## 2019-11-22 DIAGNOSIS — E039 Hypothyroidism, unspecified: Secondary | ICD-10-CM | POA: Diagnosis not present

## 2019-11-30 DIAGNOSIS — O99891 Other specified diseases and conditions complicating pregnancy: Secondary | ICD-10-CM | POA: Diagnosis not present

## 2019-11-30 DIAGNOSIS — Z3A31 31 weeks gestation of pregnancy: Secondary | ICD-10-CM | POA: Diagnosis not present

## 2019-12-07 DIAGNOSIS — O99891 Other specified diseases and conditions complicating pregnancy: Secondary | ICD-10-CM | POA: Diagnosis not present

## 2019-12-07 DIAGNOSIS — Z3A32 32 weeks gestation of pregnancy: Secondary | ICD-10-CM | POA: Diagnosis not present

## 2019-12-18 DIAGNOSIS — L821 Other seborrheic keratosis: Secondary | ICD-10-CM | POA: Diagnosis not present

## 2019-12-18 DIAGNOSIS — L918 Other hypertrophic disorders of the skin: Secondary | ICD-10-CM | POA: Diagnosis not present

## 2019-12-18 DIAGNOSIS — L858 Other specified epidermal thickening: Secondary | ICD-10-CM | POA: Diagnosis not present

## 2019-12-18 DIAGNOSIS — L718 Other rosacea: Secondary | ICD-10-CM | POA: Diagnosis not present

## 2019-12-20 DIAGNOSIS — Z8759 Personal history of other complications of pregnancy, childbirth and the puerperium: Secondary | ICD-10-CM | POA: Diagnosis not present

## 2019-12-26 DIAGNOSIS — Z331 Pregnant state, incidental: Secondary | ICD-10-CM | POA: Diagnosis not present

## 2019-12-26 DIAGNOSIS — Z20822 Contact with and (suspected) exposure to covid-19: Secondary | ICD-10-CM | POA: Diagnosis not present

## 2019-12-26 DIAGNOSIS — R05 Cough: Secondary | ICD-10-CM | POA: Diagnosis not present

## 2019-12-26 DIAGNOSIS — J069 Acute upper respiratory infection, unspecified: Secondary | ICD-10-CM | POA: Diagnosis not present

## 2019-12-26 DIAGNOSIS — O99513 Diseases of the respiratory system complicating pregnancy, third trimester: Secondary | ICD-10-CM | POA: Diagnosis not present

## 2019-12-26 DIAGNOSIS — R0981 Nasal congestion: Secondary | ICD-10-CM | POA: Diagnosis not present

## 2019-12-26 DIAGNOSIS — R030 Elevated blood-pressure reading, without diagnosis of hypertension: Secondary | ICD-10-CM | POA: Diagnosis not present

## 2019-12-26 DIAGNOSIS — R0989 Other specified symptoms and signs involving the circulatory and respiratory systems: Secondary | ICD-10-CM | POA: Diagnosis not present

## 2020-01-02 ENCOUNTER — Inpatient Hospital Stay (HOSPITAL_COMMUNITY)
Admission: RE | Admit: 2020-01-02 | Discharge: 2020-01-06 | DRG: 788 | Disposition: A | Discharge status: Home / Self Care | Payer: BC Managed Care – PPO | Attending: Obstetrics and Gynecology | Admitting: Obstetrics and Gynecology

## 2020-01-02 ENCOUNTER — Encounter (HOSPITAL_COMMUNITY): Payer: Self-pay

## 2020-01-02 DIAGNOSIS — Z20822 Contact with and (suspected) exposure to covid-19: Secondary | ICD-10-CM | POA: Diagnosis present

## 2020-01-02 DIAGNOSIS — O99892 Other specified diseases and conditions complicating childbirth: Secondary | ICD-10-CM | POA: Diagnosis not present

## 2020-01-02 DIAGNOSIS — Z3A36 36 weeks gestation of pregnancy: Secondary | ICD-10-CM

## 2020-01-02 DIAGNOSIS — O42913 Preterm premature rupture of membranes, unspecified as to length of time between rupture and onset of labor, third trimester: Secondary | ICD-10-CM | POA: Diagnosis not present

## 2020-01-02 DIAGNOSIS — R05 Cough: Secondary | ICD-10-CM | POA: Diagnosis not present

## 2020-01-02 DIAGNOSIS — O358XX0 Maternal care for other (suspected) fetal abnormality and damage, not applicable or unspecified: Secondary | ICD-10-CM | POA: Diagnosis present

## 2020-01-02 DIAGNOSIS — O43813 Placental infarction, third trimester: Secondary | ICD-10-CM | POA: Diagnosis not present

## 2020-01-02 DIAGNOSIS — O34211 Maternal care for low transverse scar from previous cesarean delivery: Principal | ICD-10-CM | POA: Diagnosis present

## 2020-01-02 DIAGNOSIS — Z98891 History of uterine scar from previous surgery: Secondary | ICD-10-CM

## 2020-01-02 LAB — CBC
HCT: 37.3 % (ref 36.0–46.0)
Hemoglobin: 12.9 g/dL (ref 12.0–15.0)
MCH: 30.1 pg (ref 26.0–34.0)
MCHC: 34.6 g/dL (ref 30.0–36.0)
MCV: 87.1 fL (ref 80.0–100.0)
Platelets: 239 10*3/uL (ref 150–400)
RBC: 4.28 MIL/uL (ref 3.87–5.11)
RDW: 12.6 % (ref 11.5–15.5)
WBC: 11.3 10*3/uL — ABNORMAL HIGH (ref 4.0–10.5)
nRBC: 0 % (ref 0.0–0.2)

## 2020-01-02 LAB — COMPREHENSIVE METABOLIC PANEL
ALT: 33 U/L (ref 0–44)
AST: 22 U/L (ref 15–41)
Albumin: 3 g/dL — ABNORMAL LOW (ref 3.5–5.0)
Alkaline Phosphatase: 123 U/L (ref 38–126)
Anion gap: 9 (ref 5–15)
BUN: 17 mg/dL (ref 6–20)
CO2: 20 mmol/L — ABNORMAL LOW (ref 22–32)
Calcium: 9.8 mg/dL (ref 8.9–10.3)
Chloride: 108 mmol/L (ref 98–111)
Creatinine, Ser: 0.71 mg/dL (ref 0.44–1.00)
GFR calc Af Amer: >60 mL/min (ref >60)
GFR calc non Af Amer: >60 mL/min (ref >60)
Glucose, Bld: 93 mg/dL (ref 70–99)
Potassium: 3.9 mmol/L (ref 3.5–5.1)
Sodium: 137 mmol/L (ref 135–145)
Total Bilirubin: 0.4 mg/dL (ref 0.3–1.2)
Total Protein: 6.6 g/dL (ref 6.5–8.1)

## 2020-01-02 LAB — POCT FERN TEST: POCT Fern Test: POSITIVE

## 2020-01-02 LAB — TYPE AND SCREEN
ABO/RH(D): A POS
Antibody Screen: NEGATIVE

## 2020-01-02 LAB — ABO/RH: ABO/RH(D): A POS

## 2020-01-02 MED ORDER — CEFAZOLIN SODIUM-DEXTROSE 2-4 GM/100ML-% IV SOLN
2.0000 g | INTRAVENOUS | Status: AC
  Administered 2020-01-03: 2 g via INTRAVENOUS
  Filled 2020-01-02: qty 100

## 2020-01-02 MED ORDER — SOD CITRATE-CITRIC ACID 500-334 MG/5ML PO SOLN
30.0000 mL | ORAL | Status: AC
  Administered 2020-01-03: 30 mL via ORAL
  Filled 2020-01-02: qty 30

## 2020-01-02 MED ORDER — LACTATED RINGERS IV SOLN: INTRAVENOUS | Status: DC

## 2020-01-02 NOTE — MAU Note (Signed)
Had gush fld at 2000 that was clear with ? Yellow in color. Having some ctxs. Just finished steroids and antibiotics today for "a cold". Negative for covid last wk. For repeat c/s.

## 2020-01-03 ENCOUNTER — Encounter (HOSPITAL_COMMUNITY)
Admission: RE | Disposition: A | Discharge status: Home / Self Care | Payer: Self-pay | Source: Home / Self Care | Attending: Obstetrics and Gynecology

## 2020-01-03 ENCOUNTER — Inpatient Hospital Stay (HOSPITAL_COMMUNITY): Payer: BC Managed Care – PPO | Admitting: Anesthesiology

## 2020-01-03 ENCOUNTER — Encounter (HOSPITAL_COMMUNITY): Payer: Self-pay | Admitting: Obstetrics and Gynecology

## 2020-01-03 DIAGNOSIS — Z98891 History of uterine scar from previous surgery: Secondary | ICD-10-CM

## 2020-01-03 DIAGNOSIS — O43813 Placental infarction, third trimester: Secondary | ICD-10-CM | POA: Diagnosis not present

## 2020-01-03 DIAGNOSIS — O42913 Preterm premature rupture of membranes, unspecified as to length of time between rupture and onset of labor, third trimester: Secondary | ICD-10-CM | POA: Diagnosis present

## 2020-01-03 DIAGNOSIS — O358XX0 Maternal care for other (suspected) fetal abnormality and damage, not applicable or unspecified: Secondary | ICD-10-CM | POA: Diagnosis present

## 2020-01-03 DIAGNOSIS — Z20822 Contact with and (suspected) exposure to covid-19: Secondary | ICD-10-CM | POA: Diagnosis present

## 2020-01-03 DIAGNOSIS — O34211 Maternal care for low transverse scar from previous cesarean delivery: Secondary | ICD-10-CM | POA: Diagnosis not present

## 2020-01-03 DIAGNOSIS — O99892 Other specified diseases and conditions complicating childbirth: Secondary | ICD-10-CM | POA: Diagnosis present

## 2020-01-03 DIAGNOSIS — Z3A36 36 weeks gestation of pregnancy: Secondary | ICD-10-CM | POA: Diagnosis not present

## 2020-01-03 DIAGNOSIS — R05 Cough: Secondary | ICD-10-CM | POA: Diagnosis present

## 2020-01-03 HISTORY — DX: History of uterine scar from previous surgery: Z98.891

## 2020-01-03 LAB — CBC
HCT: 33.5 % — ABNORMAL LOW (ref 36.0–46.0)
Hemoglobin: 11.4 g/dL — ABNORMAL LOW (ref 12.0–15.0)
MCH: 29.6 pg (ref 26.0–34.0)
MCHC: 34 g/dL (ref 30.0–36.0)
MCV: 87 fL (ref 80.0–100.0)
Platelets: 215 10*3/uL (ref 150–400)
RBC: 3.85 MIL/uL — ABNORMAL LOW (ref 3.87–5.11)
RDW: 12.4 % (ref 11.5–15.5)
WBC: 15.4 10*3/uL — ABNORMAL HIGH (ref 4.0–10.5)
nRBC: 0 % (ref 0.0–0.2)

## 2020-01-03 LAB — SARS CORONAVIRUS 2 BY RT PCR (HOSPITAL ORDER, PERFORMED IN ~~LOC~~ HOSPITAL LAB): SARS Coronavirus 2: NEGATIVE

## 2020-01-03 LAB — RPR: RPR Ser Ql: NONREACTIVE

## 2020-01-03 SURGERY — Surgical Case
Anesthesia: Spinal

## 2020-01-03 MED ORDER — SIMETHICONE 80 MG PO CHEW
80.0000 mg | CHEWABLE_TABLET | Freq: Three times a day (TID) | ORAL | Status: DC
  Administered 2020-01-03 – 2020-01-06 (×10): 80 mg via ORAL
  Filled 2020-01-03 (×11): qty 1

## 2020-01-03 MED ORDER — FENTANYL CITRATE (PF) 100 MCG/2ML IJ SOLN
INTRAMUSCULAR | Status: AC
  Filled 2020-01-03: qty 2

## 2020-01-03 MED ORDER — LACTATED RINGERS IV SOLN: INTRAVENOUS | Status: DC

## 2020-01-03 MED ORDER — MORPHINE SULFATE (PF) 0.5 MG/ML IJ SOLN
INTRAMUSCULAR | Status: DC | PRN
  Administered 2020-01-03: .15 mg via INTRATHECAL

## 2020-01-03 MED ORDER — FENTANYL CITRATE (PF) 100 MCG/2ML IJ SOLN
INTRAMUSCULAR | Status: DC | PRN
  Administered 2020-01-03: 15 ug via INTRATHECAL

## 2020-01-03 MED ORDER — ACETAMINOPHEN 500 MG PO TABS
1000.0000 mg | ORAL_TABLET | Freq: Four times a day (QID) | ORAL | Status: DC
  Administered 2020-01-03 – 2020-01-06 (×11): 1000 mg via ORAL
  Filled 2020-01-03 (×11): qty 2

## 2020-01-03 MED ORDER — HYDROMORPHONE HCL 1 MG/ML IJ SOLN: 0.2000 mg | INTRAMUSCULAR | Status: DC | PRN

## 2020-01-03 MED ORDER — OXYTOCIN-SODIUM CHLORIDE 30-0.9 UT/500ML-% IV SOLN
INTRAVENOUS | Status: AC
  Filled 2020-01-03: qty 500

## 2020-01-03 MED ORDER — COCONUT OIL OIL: 1.0000 "application " | TOPICAL_OIL | Status: DC | PRN

## 2020-01-03 MED ORDER — ACETAMINOPHEN 325 MG PO TABS
650.0000 mg | ORAL_TABLET | ORAL | Status: DC | PRN
  Administered 2020-01-03 (×2): 650 mg via ORAL
  Filled 2020-01-03 (×2): qty 2

## 2020-01-03 MED ORDER — MEPERIDINE HCL 25 MG/ML IJ SOLN: 6.2500 mg | INTRAMUSCULAR | Status: DC | PRN

## 2020-01-03 MED ORDER — KETOROLAC TROMETHAMINE 30 MG/ML IJ SOLN
INTRAMUSCULAR | Status: AC
  Filled 2020-01-03: qty 1

## 2020-01-03 MED ORDER — NALBUPHINE HCL 10 MG/ML IJ SOLN
5.0000 mg | INTRAMUSCULAR | Status: DC | PRN
  Administered 2020-01-03 (×3): 5 mg via INTRAVENOUS
  Filled 2020-01-03 (×3): qty 1

## 2020-01-03 MED ORDER — BUPIVACAINE IN DEXTROSE 0.75-8.25 % IT SOLN
INTRATHECAL | Status: DC | PRN
  Administered 2020-01-03: 1.6 mL via INTRATHECAL

## 2020-01-03 MED ORDER — WITCH HAZEL-GLYCERIN EX PADS: 1.0000 "application " | MEDICATED_PAD | CUTANEOUS | Status: DC | PRN

## 2020-01-03 MED ORDER — PHENYLEPHRINE HCL-NACL 20-0.9 MG/250ML-% IV SOLN
INTRAVENOUS | Status: AC
  Filled 2020-01-03: qty 250

## 2020-01-03 MED ORDER — ACETAMINOPHEN 160 MG/5ML PO SOLN: 325.0000 mg | Freq: Once | ORAL | Status: DC | PRN

## 2020-01-03 MED ORDER — OXYTOCIN-SODIUM CHLORIDE 30-0.9 UT/500ML-% IV SOLN
INTRAVENOUS | Status: DC | PRN
  Administered 2020-01-03: 200 mL via INTRAVENOUS

## 2020-01-03 MED ORDER — DIPHENHYDRAMINE HCL 25 MG PO CAPS
25.0000 mg | ORAL_CAPSULE | Freq: Four times a day (QID) | ORAL | Status: DC | PRN
  Administered 2020-01-03 – 2020-01-04 (×4): 25 mg via ORAL
  Filled 2020-01-03 (×2): qty 1

## 2020-01-03 MED ORDER — ACETAMINOPHEN 10 MG/ML IV SOLN: 1000.0000 mg | Freq: Once | INTRAVENOUS | Status: DC | PRN

## 2020-01-03 MED ORDER — NALOXONE HCL 0.4 MG/ML IJ SOLN: 0.4000 mg | INTRAMUSCULAR | Status: DC | PRN

## 2020-01-03 MED ORDER — NALOXONE HCL 4 MG/10ML IJ SOLN
1.0000 ug/kg/h | INTRAVENOUS | Status: DC | PRN
  Filled 2020-01-03: qty 5

## 2020-01-03 MED ORDER — PRENATAL MULTIVITAMIN CH
1.0000 | ORAL_TABLET | Freq: Every day | ORAL | Status: DC
  Administered 2020-01-03 – 2020-01-06 (×4): 1 via ORAL
  Filled 2020-01-03 (×4): qty 1

## 2020-01-03 MED ORDER — FAMOTIDINE IN NACL 20-0.9 MG/50ML-% IV SOLN
20.0000 mg | Freq: Once | INTRAVENOUS | Status: AC
  Administered 2020-01-03: 20 mg via INTRAVENOUS
  Filled 2020-01-03: qty 50

## 2020-01-03 MED ORDER — NALBUPHINE HCL 10 MG/ML IJ SOLN: 5.0000 mg | Freq: Once | INTRAMUSCULAR | Status: DC | PRN

## 2020-01-03 MED ORDER — PHENYLEPHRINE HCL-NACL 20-0.9 MG/250ML-% IV SOLN
INTRAVENOUS | Status: DC | PRN
  Administered 2020-01-03: 60 ug/min via INTRAVENOUS

## 2020-01-03 MED ORDER — SCOPOLAMINE 1 MG/3DAYS TD PT72
MEDICATED_PATCH | TRANSDERMAL | Status: AC
  Filled 2020-01-03: qty 1

## 2020-01-03 MED ORDER — NALBUPHINE HCL 10 MG/ML IJ SOLN: 5.0000 mg | INTRAMUSCULAR | Status: DC | PRN

## 2020-01-03 MED ORDER — SODIUM CHLORIDE 0.9% FLUSH: 3.0000 mL | INTRAVENOUS | Status: DC | PRN

## 2020-01-03 MED ORDER — SENNOSIDES-DOCUSATE SODIUM 8.6-50 MG PO TABS
2.0000 | ORAL_TABLET | ORAL | Status: DC
  Administered 2020-01-03 – 2020-01-04 (×2): 2 via ORAL
  Filled 2020-01-03 (×3): qty 2

## 2020-01-03 MED ORDER — SCOPOLAMINE 1 MG/3DAYS TD PT72
1.0000 | MEDICATED_PATCH | Freq: Once | TRANSDERMAL | Status: AC
  Administered 2020-01-03: 1.5 mg via TRANSDERMAL

## 2020-01-03 MED ORDER — ONDANSETRON HCL 4 MG/2ML IJ SOLN
4.0000 mg | Freq: Three times a day (TID) | INTRAMUSCULAR | Status: DC | PRN
  Administered 2020-01-04: 4 mg via INTRAVENOUS
  Filled 2020-01-03: qty 2

## 2020-01-03 MED ORDER — SODIUM CHLORIDE 0.9 % IR SOLN
Status: DC | PRN
  Administered 2020-01-03: 1

## 2020-01-03 MED ORDER — DIBUCAINE (PERIANAL) 1 % EX OINT: 1.0000 "application " | TOPICAL_OINTMENT | CUTANEOUS | Status: DC | PRN

## 2020-01-03 MED ORDER — IBUPROFEN 600 MG PO TABS
600.0000 mg | ORAL_TABLET | Freq: Four times a day (QID) | ORAL | Status: DC
  Administered 2020-01-04 – 2020-01-06 (×9): 600 mg via ORAL
  Filled 2020-01-03 (×9): qty 1

## 2020-01-03 MED ORDER — OXYCODONE HCL 5 MG PO TABS
5.0000 mg | ORAL_TABLET | ORAL | Status: DC | PRN
  Filled 2020-01-03: qty 2

## 2020-01-03 MED ORDER — LACTATED RINGERS IV SOLN
INTRAVENOUS | Status: DC | PRN
Start: 2020-01-03 — End: 2020-01-03

## 2020-01-03 MED ORDER — BENZONATATE 100 MG PO CAPS
100.0000 mg | ORAL_CAPSULE | Freq: Three times a day (TID) | ORAL | Status: DC | PRN
  Administered 2020-01-04 – 2020-01-05 (×4): 100 mg via ORAL
  Filled 2020-01-03 (×8): qty 1

## 2020-01-03 MED ORDER — SIMETHICONE 80 MG PO CHEW
80.0000 mg | CHEWABLE_TABLET | ORAL | Status: DC
  Administered 2020-01-03 – 2020-01-05 (×3): 80 mg via ORAL
  Filled 2020-01-03 (×3): qty 1

## 2020-01-03 MED ORDER — ACETAMINOPHEN 325 MG PO TABS: 325.0000 mg | ORAL_TABLET | Freq: Once | ORAL | Status: DC | PRN

## 2020-01-03 MED ORDER — FENTANYL CITRATE (PF) 100 MCG/2ML IJ SOLN: 25.0000 ug | INTRAMUSCULAR | Status: DC | PRN

## 2020-01-03 MED ORDER — PROMETHAZINE HCL 25 MG/ML IJ SOLN: 6.2500 mg | INTRAMUSCULAR | Status: DC | PRN

## 2020-01-03 MED ORDER — DIPHENHYDRAMINE HCL 25 MG PO CAPS
25.0000 mg | ORAL_CAPSULE | ORAL | Status: DC | PRN
  Filled 2020-01-03 (×2): qty 1

## 2020-01-03 MED ORDER — ONDANSETRON HCL 4 MG/2ML IJ SOLN
INTRAMUSCULAR | Status: AC
  Filled 2020-01-03: qty 2

## 2020-01-03 MED ORDER — MENTHOL 3 MG MT LOZG
1.0000 | LOZENGE | OROMUCOSAL | Status: DC | PRN
  Administered 2020-01-04 – 2020-01-05 (×5): 3 mg via ORAL
  Filled 2020-01-03 (×2): qty 9

## 2020-01-03 MED ORDER — ONDANSETRON HCL 4 MG/2ML IJ SOLN
INTRAMUSCULAR | Status: DC | PRN
  Administered 2020-01-03: 4 mg via INTRAVENOUS

## 2020-01-03 MED ORDER — MORPHINE SULFATE (PF) 0.5 MG/ML IJ SOLN
INTRAMUSCULAR | Status: AC
  Filled 2020-01-03: qty 10

## 2020-01-03 MED ORDER — DIPHENHYDRAMINE HCL 50 MG/ML IJ SOLN: 12.5000 mg | INTRAMUSCULAR | Status: DC | PRN

## 2020-01-03 MED ORDER — TETANUS-DIPHTH-ACELL PERTUSSIS 5-2.5-18.5 LF-MCG/0.5 IM SUSP: 0.5000 mL | Freq: Once | INTRAMUSCULAR | Status: DC

## 2020-01-03 MED ORDER — OXYTOCIN-SODIUM CHLORIDE 30-0.9 UT/500ML-% IV SOLN: 2.5000 [IU]/h | INTRAVENOUS | Status: AC

## 2020-01-03 MED ORDER — KETOROLAC TROMETHAMINE 30 MG/ML IJ SOLN: 30.0000 mg | Freq: Four times a day (QID) | INTRAMUSCULAR | Status: AC | PRN

## 2020-01-03 MED ORDER — SIMETHICONE 80 MG PO CHEW: 80.0000 mg | CHEWABLE_TABLET | ORAL | Status: DC | PRN

## 2020-01-03 MED ORDER — KETOROLAC TROMETHAMINE 30 MG/ML IJ SOLN
30.0000 mg | Freq: Four times a day (QID) | INTRAMUSCULAR | Status: AC | PRN
  Administered 2020-01-03 – 2020-01-04 (×3): 30 mg via INTRAVENOUS
  Filled 2020-01-03 (×2): qty 1

## 2020-01-03 MED ORDER — SERTRALINE HCL 50 MG PO TABS
50.0000 mg | ORAL_TABLET | Freq: Every day | ORAL | Status: DC
  Filled 2020-01-03 (×2): qty 1

## 2020-01-03 MED ORDER — ZOLPIDEM TARTRATE 5 MG PO TABS: 5.0000 mg | ORAL_TABLET | Freq: Every evening | ORAL | Status: DC | PRN

## 2020-01-03 SURGICAL SUPPLY — 39 items
BENZOIN TINCTURE PRP APPL 2/3 (GAUZE/BANDAGES/DRESSINGS) ×3 IMPLANT
CHLORAPREP W/TINT 26ML (MISCELLANEOUS) ×3 IMPLANT
CLAMP CORD UMBIL (MISCELLANEOUS) IMPLANT
CLOSURE STERI-STRIP 1/4X4 (GAUZE/BANDAGES/DRESSINGS) ×3 IMPLANT
CLOSURE WOUND 1/2 X4 (GAUZE/BANDAGES/DRESSINGS)
CLOTH BEACON ORANGE TIMEOUT ST (SAFETY) ×3 IMPLANT
DERMABOND ADVANCED (GAUZE/BANDAGES/DRESSINGS)
DERMABOND ADVANCED .7 DNX12 (GAUZE/BANDAGES/DRESSINGS) IMPLANT
DRSG OPSITE POSTOP 4X10 (GAUZE/BANDAGES/DRESSINGS) ×3 IMPLANT
ELECT REM PT RETURN 9FT ADLT (ELECTROSURGICAL) ×3
ELECTRODE REM PT RTRN 9FT ADLT (ELECTROSURGICAL) ×1 IMPLANT
EXTRACTOR VACUUM M CUP 4 TUBE (SUCTIONS) IMPLANT
EXTRACTOR VACUUM M CUP 4' TUBE (SUCTIONS)
GAUZE SPONGE 4X4 12PLY STRL LF (GAUZE/BANDAGES/DRESSINGS) ×6 IMPLANT
GLOVE BIO SURGEON STRL SZ7.5 (GLOVE) ×3 IMPLANT
GLOVE BIOGEL PI IND STRL 7.0 (GLOVE) ×1 IMPLANT
GLOVE BIOGEL PI INDICATOR 7.0 (GLOVE) ×2
GOWN STRL REUS W/TWL LRG LVL3 (GOWN DISPOSABLE) ×6 IMPLANT
KIT ABG SYR 3ML LUER SLIP (SYRINGE) ×3 IMPLANT
NEEDLE HYPO 25X5/8 SAFETYGLIDE (NEEDLE) ×3 IMPLANT
NS IRRIG 1000ML POUR BTL (IV SOLUTION) ×3 IMPLANT
PACK C SECTION WH (CUSTOM PROCEDURE TRAY) ×3 IMPLANT
PAD ABD 7.5X8 STRL (GAUZE/BANDAGES/DRESSINGS) ×3 IMPLANT
PAD OB MATERNITY 4.3X12.25 (PERSONAL CARE ITEMS) ×3 IMPLANT
PENCIL SMOKE EVAC W/HOLSTER (ELECTROSURGICAL) ×3 IMPLANT
STRIP CLOSURE SKIN 1/2X4 (GAUZE/BANDAGES/DRESSINGS) IMPLANT
SUT CHROMIC 2 0 SH (SUTURE) ×3 IMPLANT
SUT CHROMIC 3 0 SH 27 (SUTURE) ×3 IMPLANT
SUT MNCRL 0 VIOLET CTX 36 (SUTURE) ×4 IMPLANT
SUT MONOCRYL 0 CTX 36 (SUTURE) ×8
SUT PDS AB 0 CTX 60 (SUTURE) ×3 IMPLANT
SUT PLAIN 0 NONE (SUTURE) IMPLANT
SUT PLAIN 2 0 (SUTURE)
SUT PLAIN 2 0 XLH (SUTURE) ×3 IMPLANT
SUT PLAIN ABS 2-0 CT1 27XMFL (SUTURE) IMPLANT
SUT VIC AB 4-0 KS 27 (SUTURE) ×3 IMPLANT
TOWEL OR 17X24 6PK STRL BLUE (TOWEL DISPOSABLE) ×3 IMPLANT
TRAY FOLEY W/BAG SLVR 14FR LF (SET/KITS/TRAYS/PACK) ×3 IMPLANT
WATER STERILE IRR 1000ML POUR (IV SOLUTION) ×3 IMPLANT

## 2020-01-03 NOTE — Lactation Note (Signed)
This note was copied from a baby's chart. Lactation Consultation Note  Patient Name: Nancy Wong [id number] Date: 01/03/2020 Reason for consult: Follow-up assessment;Mother's request Mom is a g2p2 csection delivery of LPTI baby Nancy Nancy Wong now 49 hours old.   Assisted mom with trying to feed james on the left breast in football hold.  He is sleepier at this feed and keeps coming off and on but rooting.  Assisted with 24 mm nipple shield.  Mom reports she thinks that is too big for James mouth so switched to 20 mm nipple shield.  He does some rythmic sucking and just holds nipple in mouth.   Urged mom to go ahead and use DEBP andhand express and feed him  Back whatever she can get and formula as presrcibed to make up the recommended supplement amount.  Urged parents to feed him more if he wanted more.  Mom got a few drops with pumping on the left.  Left parents doing some hand expression.  Provided colostrum containers and curved tip syringe.  Urged to feed on cue and 8-12 or more times day. Urged to call lactation as needed.  Maternal Data    Feeding Feeding Type: Breast Fed  LATCH Score Latch: Repeated attempts needed to sustain latch, nipple held in mouth throughout feeding, stimulation needed to elicit sucking reflex.  Audible Swallowing: A few with stimulation  Type of Nipple: Flat (left nipple dimpled in center)  Comfort (Breast/Nipple): Soft / non-tender (nipple compressed on left breast without nipple shield)  Hold (Positioning): Assistance needed to correctly position infant at breast and maintain latch.  LATCH Score: 6  Interventions Interventions: Breast feeding basics reviewed;Assisted with latch;Coconut oil;DEBP  Lactation Tools Discussed/Used Tools: Nipple Shields Nipple shield size: 20 WIC Program: No Pump Review: Setup, frequency, and cleaning;Milk Storage Initiated by:: Tavaris Eudy Date initiated:: 01/03/20   Consult Status Consult Status: Follow-up Date:  01/04/20 Follow-up type: In-patient    [facility] Michaelle Copas 01/03/2020, 10:56 PM

## 2020-01-03 NOTE — Anesthesia Preprocedure Evaluation (Addendum)
Anesthesia Evaluation  Patient identified by MRN, date of birth, ID band Patient awake    Reviewed: Allergy & Precautions, NPO status , Patient's Chart, lab work & pertinent test results  Airway Mallampati: II  TM Distance: >3 FB Neck ROM: Full    Dental  (+) Teeth Intact   Pulmonary neg pulmonary ROS,    Pulmonary exam normal        Cardiovascular hypertension,  Rhythm:Regular Rate:Normal     Neuro/Psych Anxiety negative neurological ROS     GI/Hepatic negative GI ROS, Neg liver ROS,   Endo/Other  Hypothyroidism   Renal/GU negative Renal ROS     Musculoskeletal negative musculoskeletal ROS (+)   Abdominal   Peds  Hematology negative hematology ROS (+)   Anesthesia Other Findings   Reproductive/Obstetrics                            Anesthesia Physical Anesthesia Plan  ASA: II  Anesthesia Plan: Spinal   Post-op Pain Management:    Induction:   PONV Risk Score and Plan: 0 and Ondansetron  Airway Management Planned: Natural Airway and Simple Face Mask  Additional Equipment: None  Intra-op Plan:   Post-operative Plan:   Informed Consent: I have reviewed the patients History and Physical, chart, labs and discussed the procedure including the risks, benefits and alternatives for the proposed anesthesia with the patient or authorized representative who has indicated his/her understanding and acceptance.       Plan Discussed with: CRNA  Anesthesia Plan Comments: (Lab Results      Component                Value               Date                      WBC                      11.3 (H)            01/02/2020                HGB                      12.9                01/02/2020                HCT                      37.3                01/02/2020                MCV                      87.1                01/02/2020                PLT                      239                  01/02/2020           )  Anesthesia Quick Evaluation  

## 2020-01-03 NOTE — MAU Note (Signed)
Covid swab obtained without difficulty and pt tol well. Is getting over and URI and has a cough. Finished antibiotics and steroids on Weds. 01/02/20

## 2020-01-03 NOTE — Progress Notes (Signed)
MOB was referred for history of depression/anxiety. * Referral screened out by Clinical Social Worker because none of the following criteria appear to apply: ~ History of anxiety/depression during this pregnancy, or of post-partum depression following prior delivery. ~ Diagnosis of anxiety and/or depression within last 3 years. No concerns noted in OB records. OR * MOB's symptoms currently being treated with medication and/or therapy.  Please contact the Clinical Social Worker if needs arise, by MOB request, or if MOB scores greater than 9/yes to question 10 on Edinburgh Postpartum Depression Screen.   Tylen Leverich Boyd-Gilyard, MSW, LCSW Clinical Social Work [phone]  

## 2020-01-03 NOTE — Progress Notes (Signed)
Per RN baby not feeding well and will not be ready for circ today

## 2020-01-03 NOTE — Op Note (Signed)
NAMEHODAYA, Nancy Wong MEDICAL RECORD [id number] ACCOUNT [id number] DATE OF BIRTH:1990-08-20 FACILITY: [facility] LOCATION: [facility] PHYSICIAN:Shawntell Dixson E. Bowman Higbie II, MD  OPERATIVE REPORT  DATE OF PROCEDURE:  01/03/2020  PREOPERATIVE DIAGNOSES: 1.  Spontaneous rupture of membranes. 2.  Previous cesarean section, desires repeat.  POSTOPERATIVE DIAGNOSES:   1.  Spontaneous rupture of membranes. 2.  Previous cesarean section, desires repeat.  PROCEDURE:  Repeat low transverse cesarean section.  SURGEON:  Harold Hedge, MD   ANESTHESIA:  Spinal, Dr. Lucien Mons BLOOD LOSS:  Per anesthesia record.  SPECIMENS:  Placenta to pathology.  FINDINGS:  Viable female infant.  Apgars, arterial cord pH, birth weight pending.  INDICATIONS AND CONSENT:  This patient is a 29 year old married white female G2 P1 at 36-1/7 weeks.  She had a previous cesarean section and was scheduled for a repeat.  She had spontaneous rupture of membranes for clear fluid.  Repeat cesarean section  was discussed with the patient.  Potential risks and complications were discussed preoperatively including but not limited to infection, organ damage, bleeding requiring transfusion of blood products with HIV and hepatitis acquisition, DVT, PE,  pneumonia.  The patient states she understands and agrees and consent was signed on the chart.  DESCRIPTION OF PROCEDURE:  The patient was taken to the operating room where she was identified.  Spinal anesthetic was placed per anesthesiology and she was placed in the dorsal supine position with a 15-degree left lateral wedge.  She was prepped  vaginally with Betadine.  Foley catheter was placed and she was prepped abdominally with ChloraPrep.  After a 3-minute drying time timeout was undertaken and she was draped in a sterile fashion.  After testing for adequate spinal anesthesia, skin was  entered through the previous Pfannenstiel scar and dissection was carried out to the peritoneum.   The peritoneum was adherent in the midline along much of the anterior uterine fundus.  It occupies the middle 1/3 of the uterine surface.  Peritoneum is  safely and successfully entered and these adhesions were taken down sharply under good visualization.  This allowed visualization of the vesicouterine peritoneum, which was also taken down to safely advance the bladder.  Bladder blade was placed.  Uterus  was incised in a low transverse manner and the uterine cavity was entered bluntly with a hemostat.  Uterine incision was extended with the fingers.  Vertex was delivered and a nuchal cord x1 was reduced.  Remainder of the baby was delivered and good cry  and tone was noted.  After 1 minute, cord was clamped and cut and the baby was handed to waiting pediatrics team.  Placenta is manually delivered and sent to pathology.  Uterus was cleaned.  Uterus was closed in 2 running locking imbricating layers of 0  Monocryl suture, which achieved good hemostasis.  Copious lavage was carried out.  The peritoneal defect on the anterior uterine fundus is closed with a 2-0 chromic suture.  This created good hemostasis.  The anterior peritoneum was then closed in a  running fashion with a 0 Monocryl suture, which was also used to reapproximate the pyramidalis muscle in the midline.  The anterior rectus fascia was then closed in a running fashion with a 0 looped PDS.  Subcutaneous layer was closed with interrupted  plain and the skin was closed in a subcuticular fashion with a 4-0 Vicryl on a Keith needle.  Benzoin, Steri-Strips, honeycomb and pressure dressing is applied.  All counts were correct and the patient was taken to recovery  room in stable condition.  CN/NUANCE  D:01/03/2020 T:01/03/2020 JOB:[id number]

## 2020-01-03 NOTE — Anesthesia Procedure Notes (Signed)
Spinal  Start time: 01/03/2020 1:44 AM End time: 01/03/2020 1:46 AM Staffing Performed: anesthesiologist  Anesthesiologist: Shelton Silvas, MD Preanesthetic Checklist Completed: patient identified, IV checked, site marked, risks and benefits discussed, surgical consent, monitors and equipment checked, pre-op evaluation and timeout performed Spinal Block Patient position: sitting Prep: DuraPrep and site prepped and draped Location: L3-4 Injection technique: single-shot Needle Needle type: Pencan  Needle gauge: 24 G Needle length: 10 cm Needle insertion depth: 10 cm Additional Notes Patient tolerated well. No immediate complications.

## 2020-01-03 NOTE — Progress Notes (Signed)
Subjective: Postpartum Day 0: Cesarean Delivery Patient reports tolerating PO.    Objective: Vital signs in last 24 hours: Temp:  [98 F (36.7 C)-98.7 F (37.1 C)] 98 F (36.7 C) (06/17 0655) Pulse Rate:  [65-94] 75 (06/17 0655) Resp:  [15-23] 17 (06/17 0655) BP: (112-133)/(70-93) 120/78 (06/17 0655) SpO2:  [97 %-99 %] 99 % (06/17 0655) Weight:  [83.9 kg] 83.9 kg (06/16 2143)  Physical Exam:  General: alert Lochia: appropriate Uterine Fundus: firm Incision: covered with pressure dressing given POD#0 <8 hours from delivery DVT Evaluation: No evidence of DVT seen on physical exam.  Recent Labs    01/02/20 2222 01/03/20 0607  HGB 12.9 11.4*  HCT 37.3 33.5*    Assessment/Plan: Status post Cesarean section. Doing well postoperatively.  Continue current care. Baby boy doing well.  D/W patient female infant circumcision, risks/benefits reviewed. All questions answered.   Ranae Pila 01/03/2020, 8:38 AM

## 2020-01-03 NOTE — H&P (Signed)
Nancy Wong is a 29 y.o. female presenting for SROM. She has a previous cesarean section and is scheduled for repeat. Pregnancy complicated by Panorama C/W XYY>MFM consult done-decline amnio. UC s now becoming uncomfortable. OB History    Gravida  2   Para  1   Term  1   Preterm      AB      Living  1     SAB      TAB      Ectopic      Multiple  0   Live Births  1          Past Medical History:  Diagnosis Date  . Anxiety   . Hypertension   . Hypothyroidism    Past Surgical History:  Procedure Laterality Date  . CESAREAN SECTION N/A 02/20/2018   Procedure: CESAREAN SECTION;  Surgeon: Marcelle Overlie, MD;  Location: [facility] BIRTHING SUITES;  Service: Obstetrics;  Laterality: N/A;  . WISDOM TOOTH EXTRACTION     Family History: family history includes Cancer in her mother; Diabetes in her maternal grandfather and paternal grandfather; Healthy in her brother; Heart attack in her paternal grandfather; Heart disease in her maternal grandfather and paternal grandfather; Hypertension in her father; Kidney disease in her father. Social History:  reports that she has never smoked. She has never used smokeless tobacco. She reports that she does not drink alcohol and does not use drugs.     Maternal Diabetes: No Genetic Screening: Abnormal:  Results: Other:XYY Panorama Maternal Ultrasounds/Referrals: Normal Fetal Ultrasounds or other Referrals:  Referred to Materal Fetal Medicine  Maternal Substance Abuse:  No Significant Maternal Medications:  None Significant Maternal Lab Results:  Other: unknown Other Comments:  None  Review of Systems  Eyes: Negative for visual disturbance.  Gastrointestinal: Negative for abdominal pain.  Neurological: Negative for headaches.   Maternal Medical History:  Reason for admission: Rupture of membranes.       Blood pressure 117/76, pulse 94, temperature 98.7 F (37.1 C), resp. rate 18, height 5\' 3"  (1.6 m), weight 83.9 kg, SpO2 99 %,  currently breastfeeding. Maternal Exam:  Abdomen: Fetal presentation: vertex     Fetal Exam Fetal State Assessment: Category I - tracings are normal.     Physical Exam  Cardiovascular: Normal rate.  Respiratory: Effort normal.    Prenatal labs: ABO, Rh: --/--/A POS, A POS Performed at [facility], [address], [place], [place]  [phone] 2217) Antibody: NEG (06/16 2217) Rubella:   RPR:    HBsAg:    HIV:    GBS:     Assessment/Plan: 29 yo G2P1 @ 36 1/7 weeks with SROM D/W repeat ceserean section and risks including infection, organ damage, bleeding/transfusion-HIV/Hep, DVT/PE, pneumonia. Patient states she understands and agrees.   04-23-1987 II 01/03/2020, 1:16 AM

## 2020-01-03 NOTE — Brief Op Note (Signed)
01/02/2020 - 01/03/2020  3:03 AM  PATIENT:  Nancy Wong  29 y.o. female  PRE-OPERATIVE DIAGNOSIS:  REPEAT CESAREN SECTION, rupture of membranes  POST-OPERATIVE DIAGNOSIS:  repeat cesaren section, rupture of membranes   PROCEDURE:  Procedure(s) with comments: REPEAT CESAREAN SECTION (N/A) - Tracey RNFA  SURGEON:  Surgeon(s) and Role:    * Harold Hedge, MD - Primary  PHYSICIAN ASSISTANT:   ASSISTANTS: none   ANESTHESIA:   spinal  EBL:  Per anesthesia record   BLOOD ADMINISTERED:none  DRAINS: Urinary Catheter (Foley)   LOCAL MEDICATIONS USED:  NONE  SPECIMEN:  Source of Specimen:  placenta  DISPOSITION OF SPECIMEN:  PATHOLOGY  COUNTS:  YES  TOURNIQUET:  * No tourniquets in log *  DICTATION: .Other Dictation: Dictation Number [id number]  PLAN OF CARE: Admit to inpatient   PATIENT DISPOSITION:  PACU - hemodynamically stable.   Delay start of Pharmacological VTE agent (>24hrs) due to surgical blood loss or risk of bleeding: not applicable

## 2020-01-03 NOTE — Anesthesia Postprocedure Evaluation (Signed)
Anesthesia Post Note  Patient: Nathaly Dawkins  Procedure(s) Performed: REPEAT CESAREAN SECTION (N/A )     Patient location during evaluation: PACU Anesthesia Type: Spinal Level of consciousness: oriented and awake and alert Pain management: pain level controlled Vital Signs Assessment: post-procedure vital signs reviewed and stable Respiratory status: spontaneous breathing, respiratory function stable and patient connected to nasal cannula oxygen Cardiovascular status: blood pressure returned to baseline and stable Postop Assessment: no headache, no backache and no apparent nausea or vomiting Anesthetic complications: no   No complications documented.  Last Vitals:  Vitals:   01/03/20 0400 01/03/20 0415  BP: 124/83 125/81  Pulse: 65 78  Resp: 18 (!) 21  Temp:    SpO2: 97% 99%    Last Pain:  Vitals:   01/03/20 0415  TempSrc:   PainSc: 0-No pain   Pain Goal: Patients Stated Pain Goal: 0 (01/02/20 2152)  LLE Motor Response: Purposeful movement (01/03/20 0415) LLE Sensation: Tingling (01/03/20 0415) RLE Motor Response: Purposeful movement (01/03/20 0415) RLE Sensation: Tingling (01/03/20 0415)     Epidural/Spinal Function Cutaneous sensation: Able to Wiggle Toes (01/03/20 0415), Patient able to flex knees: Yes (01/03/20 0415), Patient able to lift hips off bed: No (01/03/20 0415), Back pain beyond tenderness at insertion site: No (01/03/20 0415), Progressively worsening motor and/or sensory loss: No (01/03/20 0415), Bowel and/or bladder incontinence post epidural: No (01/03/20 0415)  Shelton Silvas

## 2020-01-03 NOTE — Transfer of Care (Signed)
Immediate Anesthesia Transfer of Care Note  Patient: Nancy Wong  Procedure(s) Performed: REPEAT CESAREAN SECTION (N/A )  Patient Location: PACU  Anesthesia Type:Spinal  Level of Consciousness: awake, alert  and oriented  Airway & Oxygen Therapy: Patient Spontanous Breathing  Post-op Assessment: Report given to RN and Post -op Vital signs reviewed and stable  Post vital signs: Reviewed and stable  Last Vitals:  Vitals Value Taken Time  BP 126/72 01/03/20 0318  Temp    Pulse 91 01/03/20 0321  Resp 15 01/03/20 0321  SpO2 100 % 01/03/20 0321  Vitals shown include unvalidated device data.  Last Pain:  Vitals:   01/02/20 2152  PainSc: 1       Patients Stated Pain Goal: 0 (01/02/20 2152)  Complications: No complications documented.

## 2020-01-04 LAB — SURGICAL PATHOLOGY

## 2020-01-04 NOTE — Lactation Note (Addendum)
This note was copied from a baby's chart. Lactation Consultation Note  Patient Name: Nancy Wong Date: 01/04/2020  Nancy Wong now 69 hours old.  Mom reports he breastfed three times today pretty well.  Mom reporots she has not pumped with DEBP like she should because her cough/cold bothering her.  Plans to get on pumping more tonight.  Mom reports has been doing some hand expression.  Was able to get 5 ml earlier.  Praised breastfeeding.  Reviewed frequency stimulation makes more milk come.  Urged to call lactation as needed.   Maternal Data    Feeding Feeding Type: Formula Nipple Type: Dr. Lorne Skeens  LATCH Score Latch: Grasps breast easily, tongue down, lips flanged, rhythmical sucking.  Audible Swallowing: A few with stimulation  Type of Nipple: Everted at rest and after stimulation  Comfort (Breast/Nipple): Soft / non-tender  Hold (Positioning): Assistance needed to correctly position infant at breast and maintain latch.  LATCH Score: 8  Interventions    Lactation Tools Discussed/Used     Consult Status      Nancy Wong 01/04/2020, 8:56 PM

## 2020-01-04 NOTE — Progress Notes (Signed)
Subjective: Postpartum Day 1: Cesarean Delivery Patient reports tolerating PO, + flatus and no problems voiding.  Desires circ.  Objective: Vital signs in last 24 hours: Temp:  [97.9 F (36.6 C)-98.6 F (37 C)] 98.3 F (36.8 C) (06/18 0350) Pulse Rate:  [68-70] 68 (06/18 0350) Resp:  [17-20] 18 (06/18 0350) BP: (112-127)/(65-80) 126/77 (06/18 0350) SpO2:  [97 %-100 %] 100 % (06/18 0350)  Physical Exam:  General: alert, cooperative and appears stated age Lochia: appropriate Uterine Fundus: firm Incision: healing well, no significant drainage, no dehiscence, pressure dressing removed.  30% blood stained honeycomb. DVT Evaluation: No evidence of DVT seen on physical exam. Negative Homan's sign. No cords or calf tenderness.  Recent Labs    01/02/20 2222 01/03/20 0607  HGB 12.9 11.4*  HCT 37.3 33.5*    Assessment/Plan: Status post Cesarean section. Doing well postoperatively.  Continue current care. Replace honeycomb dressing Circ-patient is counseled re: risk of bleeding, infection and scarring.  All questions were answered and patient wishes to proceed.  Mitchel Honour 01/04/2020, 8:06 AM

## 2020-01-04 NOTE — Lactation Note (Signed)
This note was copied from a baby's chart. Lactation Consultation Note  Patient Name: Nancy Wong [id number] Date: 01/04/2020   Baby Nancy james born at 36 weeks 1 day gestation.  Assisted with breastfeeding.  Attempted for approximately 20 minutes off and on.  Infant sleepy.  Reviewed supplement amount ot add with breastfeeding. Urged mom to call lactationa sneeded  Maternal Data    Feeding    LATCH Score                   Interventions    Lactation Tools Discussed/Used     Consult Status      Neomia Dear 01/04/2020, 11:13 AM

## 2020-01-05 MED ORDER — DEXTROMETHORPHAN POLISTIREX ER 30 MG/5ML PO SUER
10.0000 mL | Freq: Two times a day (BID) | ORAL | Status: DC
  Administered 2020-01-05 – 2020-01-06 (×3): 60 mg via ORAL
  Filled 2020-01-05 (×6): qty 10

## 2020-01-05 MED ORDER — GUAIFENESIN ER 600 MG PO TB12
600.0000 mg | ORAL_TABLET | Freq: Two times a day (BID) | ORAL | Status: DC | PRN
  Administered 2020-01-05 – 2020-01-06 (×2): 600 mg via ORAL
  Filled 2020-01-05 (×5): qty 1

## 2020-01-05 NOTE — Lactation Note (Signed)
This note was copied from a baby's chart. Lactation Consultation Note  Patient Name: Nancy Wong [id number] Date: 01/05/2020 Reason for consult: Follow-up assessment Baby 63hrs old, GA 36.1, 5lb 15.4oz, wt loss 7%.  Mom sitting in bed, baby asleep and swaddled in bassinet, dad at bedside. Mom reports BF is going well, is now limiting BF to 10-66mins and supplementing with EBM for total feeding of as advised.  Reports pumped at last session, baby took all, praise given. Mom reports feeling confident and without questions, states previous LC and RN observed latch and answered all questions. Reinforced optimal skin to skin, length of feeding, and supplementation volume. Advised to call for [facility] support as needed. Mom voiced understanding and with no further concerns.  Left the room as mom prepared to pump. BGilliam, RN, IBCLC  Maternal Data    Feeding Feeding Type: Breast Fed (per mom)  LATCH Score  Interventions    Lactation Tools Discussed/Used     Consult Status Consult Status: Follow-up Date: 01/06/20 Follow-up type: In-patient    [name] 01/05/2020, 5:40 PM

## 2020-01-05 NOTE — Lactation Note (Signed)
This note was copied from a baby's chart. Lactation Consultation Note  Patient Name: Nancy Wong Date: 01/05/2020 Reason for consult: Follow-up assessment;Infant < 6lbs;Late-preterm 34-36.6wks   Baby 58 hours old.  [redacted]w[redacted]d. < 6 lbs. Mother recently pumped 25 ml.  Praised her for her efforts. Mother states she is going to eat lunch and then will call for latch assistance. She would like LC to view latch. Mother knows to feed on demand at least q 3hours and is supplementing with breastmilk and formula. Reviewed volume guidelines with a goal of 20-30 ml.     Maternal Data    Feeding Feeding Type: Bottle Fed - Formula Nipple Type: Dr. Levert Feinstein Preemie  LATCH Score                   Interventions Interventions: Breast feeding basics reviewed;DEBP  Lactation Tools Discussed/Used     Consult Status Consult Status: Follow-up Date: 01/05/20 Follow-up type: In-patient    [name] [facility] 01/05/2020, 1:07 PM

## 2020-01-05 NOTE — Progress Notes (Addendum)
Subjective: Postpartum Day 2: Cesarean Delivery Patient reports tolerating PO, + flatus and no problems voiding.  C/O chronic cough x 2 weeks.  Has taken antibiotics, prednisone taper and tessalon perles; improving but coughing causes incisional pain.  Objective: Vital signs in last 24 hours: Temp:  [97.9 F (36.6 C)-98.3 F (36.8 C)] 98.3 F (36.8 C) (06/19 0537) Pulse Rate:  [61-68] 61 (06/19 0537) Resp:  [18] 18 (06/19 0537) BP: (115-126)/(66-76) 115/76 (06/19 0537) SpO2:  [100 %] 100 % (06/19 0537)  Physical Exam:  General: alert, cooperative and appears stated age Lochia: appropriate Uterine Fundus: firm Incision: healing well, no significant drainage, no dehiscence DVT Evaluation: No evidence of DVT seen on physical exam. Negative Homan's sign. No cords or calf tenderness.  Recent Labs    01/02/20 2222 01/03/20 0607  HGB 12.9 11.4*  HCT 37.3 33.5*    Assessment/Plan: Status post Cesarean section. Doing well postoperatively.  Continue current care. Cough-mucinex and cough suppressant to bedside.  Mitchel Honour 01/05/2020, 9:20 AM

## 2020-01-06 MED ORDER — GUAIFENESIN ER 600 MG PO TB12: 600.0000 mg | ORAL_TABLET | Freq: Two times a day (BID) | ORAL | 0 refills | Status: AC | PRN

## 2020-01-06 MED ORDER — DEXTROMETHORPHAN POLISTIREX ER 30 MG/5ML PO SUER: 10.0000 mL | Freq: Two times a day (BID) | ORAL | 0 refills | Status: DC

## 2020-01-06 MED ORDER — IBUPROFEN 600 MG PO TABS: 600.0000 mg | ORAL_TABLET | Freq: Four times a day (QID) | ORAL | 0 refills | Status: DC

## 2020-01-06 MED ORDER — OXYCODONE HCL 5 MG PO TABS: 5.0000 mg | ORAL_TABLET | ORAL | 0 refills | Status: DC | PRN

## 2020-01-06 NOTE — Discharge Instructions (Signed)
Call MD for T>100.4, heavy vaginal bleeding, severe abdominal pain, intractable nausea and/or vomiting, or respiratory distress.  Call office to schedule postpartum visit in 6 weeks.  No driving while taking narcotics.  Pelvic rest x 6 weeks. °

## 2020-01-06 NOTE — Lactation Note (Signed)
This note was copied from a baby's chart. Lactation Consultation Note  Patient Name: Nancy Wong [id number] Date: 01/06/2020 Reason for consult: Follow-up assessment;Late-preterm 34-36.6wks;Nipple pain/trauma;Other (Comment) (milk is in - mom pumped off 45 ml) Baby is 81 hours old / 13.4 @ 75 hours / increased weight.  Serum Bilirubin ordered after the NP examined baby.  As LC entered the room grandmother holding baby.  Mom just finished pumping and pumped off 45 ml and milk is in.  LC reviewed the recommended breast feeding plan for LPT and recommended  Offering the breast 1st / STS and feed for 15 - 20 mins, then supplement with EBM from  A bottle. Post pump both breast for 15 - 20 mins and supplement with at ;east 30 ml of EBM.  Next feeding switch to the 2nd breast and do the same.  Option 2 - if the baby doesn't latch - try and appetizer of the EBM from a bottle and then latch.  If the baby latches allow to breast feed and then finish supplement, if not, finish the feeding with a bottle and post pump both breast for 15- 20 mins .  Per mom breast are fuller, milk is in and just pumped off 45 ml/ nipples are very sore. LC offered to assess. LC noted the nipples to be pinkish, sore in appearance, and bruised on the face of the nipple right nipple , left just bruised.  LC provided and instructed mom on the use comfort gels after feedings/ pumping x 6 days/alternating with shells while awake.  Use the EBM on the nipples and a dab of nipple balm on the nipple / areola prior to pumping.  Sore nipple and engorgement prevention and tx reviewed.  LC recommended and offered to request and LC OP appt and mom receptive.  LC placed a request for this week Thursday or Friday.  Mom has the [facility] pamphlet with phone numbers.   Serum Bili is pending D/C.      Maternal Data Has patient been taught Hand Expression?: Yes (LC reviewed with breast assessent due to soreness bilaterally and  bruising)  Feeding Feeding Type:  (baby recently fed - see doc flow sheets) Nipple Type: Dr. Levert Feinstein Preemie  LATCH Score                   Interventions Interventions: Breast feeding basics reviewed;Shells;Hand pump;Comfort gels;DEBP  Lactation Tools Discussed/Used Tools: Shells;Pump;Nipple Shields;Comfort gels;Flanges Nipple shield size:  (per mom not using it , has it if needed) Flange Size: 24;27 Shell Type: Inverted Breast pump type: Double-Electric Breast Pump;Manual Pump Review: Milk Storage;Setup, frequency, and cleaning Initiated by:: LC reviewed   Consult Status Consult Status: Follow-up (LC offered and mom receptive) Follow-up type: Out-patient    Nancy Wong 01/06/2020, 12:03 PM

## 2020-01-06 NOTE — Progress Notes (Signed)
Subjective: Postpartum Day 3: Cesarean Delivery Patient reports tolerating PO, + flatus and no problems voiding.  Cough is improving.  Objective: Vital signs in last 24 hours: Temp:  [98.6 F (37 C)-98.7 F (37.1 C)] 98.7 F (37.1 C) (06/20 0616) Pulse Rate:  [65-70] 70 (06/20 0616) Resp:  [18] 18 (06/20 0616) BP: (119-120)/(65-85) 120/85 (06/20 0616) SpO2:  [99 %] 99 % (06/19 1426)  Physical Exam:  General: alert, cooperative and appears stated age Lochia: appropriate Uterine Fundus: firm Incision: healing well, no significant drainage, no dehiscence DVT Evaluation: No evidence of DVT seen on physical exam. Negative Homan's sign. No cords or calf tenderness.  No results for input(s): HGB, HCT in the last 72 hours.  Assessment/Plan: Status post Cesarean section. Doing well postoperatively.  Discharge home with standard precautions and return to clinic in 4-6 weeks.  Nancy Wong 01/06/2020, 7:37 AM

## 2020-01-06 NOTE — Discharge Summary (Signed)
Postpartum Discharge Summary     Patient Name: Nancy Wong DOB: 12-27-1990 MRN: [id number]  Date of admission: 01/02/2020 Delivery date:01/03/2020  Delivering provider: Everlene Farrier  Date of discharge: 01/06/2020  Admitting diagnosis: Cesarean delivery delivered [O82] History of cesarean section [Z98.891] Intrauterine pregnancy: [redacted]w[redacted]d    Secondary diagnosis:  Active Problems:   Cesarean delivery delivered   History of cesarean section  Additional problems: PPROM at 36weeks    Discharge diagnosis: Preterm Pregnancy Delivered                                              Post partum procedures:n/a Augmentation: N/A Complications: None  Hospital course: Sceduled C/S   29y.o. yo [id number] admitted to the hospital 01/02/2020 for scheduled cesarean section with the following indication:Elective Repeat and PPROM at 36 weeks.Delivery details are as follows:  Membrane Rupture Time/Date: 8:05 PM ,01/02/2020   Delivery Method:C-Section, Low Transverse  Details of operation can be found in separate operative note.  Patient had an uncomplicated postpartum course.  She is ambulating, tolerating a regular diet, passing flatus, and urinating well. Patient is discharged home in stable condition on  01/06/20        Newborn Data: Birth date:01/03/2020  Birth time:2:14 AM  Gender:Female  Living status:Living  Apgars:9 ,10  Weight:2705 g     Magnesium Sulfate received: No BMZ received: No Rhophylac:No MMR:No T-DaP:Given prenatally Flu: No Transfusion:No  Physical exam  Vitals:   01/04/20 1955 01/05/20 0537 01/05/20 1426 01/06/20 0616  BP: 126/66 115/76 119/65 120/85  Pulse:  61 65 70  Resp: '18 18 18 18  ' Temp: 98.2 F (36.8 C) 98.3 F (36.8 C) 98.6 F (37 C) 98.7 F (37.1 C)  TempSrc: Oral Oral Oral Oral  SpO2: 100% 100% 99%   Weight:      Height:       General: alert, cooperative and no distress Lochia: appropriate Uterine Fundus: firm Incision: Healing  well with no significant drainage, No significant erythema, Dressing is clean, dry, and intact DVT Evaluation: No evidence of DVT seen on physical exam. Negative Homan's sign. No cords or calf tenderness. Labs: Lab Results  Component Value Date   WBC 15.4 (H) 01/03/2020   HGB 11.4 (L) 01/03/2020   HCT 33.5 (L) 01/03/2020   MCV 87.0 01/03/2020   PLT 215 01/03/2020   CMP Latest Ref Rng & Units 01/02/2020  Glucose 70 - 99 mg/dL 93  BUN 6 - 20 mg/dL 17  Creatinine 0.44 - 1.00 mg/dL 0.71  Sodium 135 - 145 mmol/L 137  Potassium 3.5 - 5.1 mmol/L 3.9  Chloride 98 - 111 mmol/L 108  CO2 22 - 32 mmol/L 20(L)  Calcium 8.9 - 10.3 mg/dL 9.8  Total Protein 6.5 - 8.1 g/dL 6.6  Total Bilirubin 0.3 - 1.2 mg/dL 0.4  Alkaline Phos 38 - 126 U/L 123  AST 15 - 41 U/L 22  ALT 0 - 44 U/L 33   Edinburgh Score: Edinburgh Postnatal Depression Scale Screening Tool 01/04/2020  I have been able to laugh and see the funny side of things. 0  I have looked forward with enjoyment to things. 0  I have blamed myself unnecessarily when things went wrong. 0  I have been anxious or worried for no good reason. 0  I have felt scared or panicky for  no good reason. 0  Things have been getting on top of me. 0  I have been so unhappy that I have had difficulty sleeping. 0  I have felt sad or miserable. 0  I have been so unhappy that I have been crying. 0  The thought of harming myself has occurred to me. 0  Edinburgh Postnatal Depression Scale Total 0     After visit meds:  Delsym Mucinex Ibuprofen Oxycodone 35m #20   Discharge home in stable condition Infant Feeding: Breast Infant Disposition:home with mother Discharge instruction: per After Visit Summary and Postpartum booklet. Activity: Advance as tolerated. Pelvic rest for 6 weeks.  Diet: routine diet Future Appointments:No future appointments. Follow up Visit: 6 weeks postpartum  01/06/2020 MLinda Hedges DO

## 2020-01-23 ENCOUNTER — Inpatient Hospital Stay (HOSPITAL_COMMUNITY): Admit: 2020-01-23 | Payer: BLUE CROSS/BLUE SHIELD | Admitting: Obstetrics and Gynecology

## 2020-02-03 ENCOUNTER — Other Ambulatory Visit: Payer: Self-pay

## 2020-02-03 ENCOUNTER — Encounter (HOSPITAL_COMMUNITY): Payer: Self-pay

## 2020-02-03 ENCOUNTER — Emergency Department (HOSPITAL_COMMUNITY): Payer: BLUE CROSS/BLUE SHIELD

## 2020-02-03 ENCOUNTER — Emergency Department (HOSPITAL_COMMUNITY)
Admission: EM | Admit: 2020-02-03 | Discharge: 2020-02-03 | Disposition: A | Discharge status: Home / Self Care | Payer: BLUE CROSS/BLUE SHIELD | Attending: Emergency Medicine | Admitting: Emergency Medicine

## 2020-02-03 DIAGNOSIS — R8290 Unspecified abnormal findings in urine: Secondary | ICD-10-CM | POA: Diagnosis not present

## 2020-02-03 DIAGNOSIS — I10 Essential (primary) hypertension: Secondary | ICD-10-CM | POA: Diagnosis not present

## 2020-02-03 DIAGNOSIS — R109 Unspecified abdominal pain: Secondary | ICD-10-CM | POA: Diagnosis not present

## 2020-02-03 DIAGNOSIS — K5900 Constipation, unspecified: Secondary | ICD-10-CM

## 2020-02-03 DIAGNOSIS — E039 Hypothyroidism, unspecified: Secondary | ICD-10-CM | POA: Insufficient documentation

## 2020-02-03 DIAGNOSIS — R1030 Lower abdominal pain, unspecified: Secondary | ICD-10-CM

## 2020-02-03 LAB — I-STAT BETA HCG BLOOD, ED (MC, WL, AP ONLY): I-stat hCG, quantitative: <5 m[IU]/mL (ref <5)

## 2020-02-03 LAB — URINALYSIS, ROUTINE W REFLEX MICROSCOPIC
Bacteria, UA: NONE SEEN
Bilirubin Urine: NEGATIVE
Glucose, UA: NEGATIVE mg/dL
Ketones, ur: NEGATIVE mg/dL
Nitrite: NEGATIVE
Protein, ur: NEGATIVE mg/dL
Specific Gravity, Urine: 1.018 (ref 1.005–1.030)
pH: 5 (ref 5.0–8.0)

## 2020-02-03 LAB — CBC WITH DIFFERENTIAL/PLATELET
Abs Immature Granulocytes: 0.03 10*3/uL (ref 0.00–0.07)
Basophils Absolute: 0 10*3/uL (ref 0.0–0.1)
Basophils Relative: 0 %
Eosinophils Absolute: 0.1 10*3/uL (ref 0.0–0.5)
Eosinophils Relative: 1 %
HCT: 37.5 % (ref 36.0–46.0)
Hemoglobin: 12.8 g/dL (ref 12.0–15.0)
Immature Granulocytes: 0 %
Lymphocytes Relative: 20 %
Lymphs Abs: 2.2 10*3/uL (ref 0.7–4.0)
MCH: 29.8 pg (ref 26.0–34.0)
MCHC: 34.1 g/dL (ref 30.0–36.0)
MCV: 87.4 fL (ref 80.0–100.0)
Monocytes Absolute: 0.6 10*3/uL (ref 0.1–1.0)
Monocytes Relative: 6 %
Neutro Abs: 8.1 10*3/uL — ABNORMAL HIGH (ref 1.7–7.7)
Neutrophils Relative %: 73 %
Platelets: 209 10*3/uL (ref 150–400)
RBC: 4.29 MIL/uL (ref 3.87–5.11)
RDW: 11.6 % (ref 11.5–15.5)
WBC: 11.1 10*3/uL — ABNORMAL HIGH (ref 4.0–10.5)
nRBC: 0 % (ref 0.0–0.2)

## 2020-02-03 LAB — COMPREHENSIVE METABOLIC PANEL
ALT: 25 U/L (ref 0–44)
AST: 20 U/L (ref 15–41)
Albumin: 4 g/dL (ref 3.5–5.0)
Alkaline Phosphatase: 75 U/L (ref 38–126)
Anion gap: 12 (ref 5–15)
BUN: 24 mg/dL — ABNORMAL HIGH (ref 6–20)
CO2: 21 mmol/L — ABNORMAL LOW (ref 22–32)
Calcium: 9.2 mg/dL (ref 8.9–10.3)
Chloride: 106 mmol/L (ref 98–111)
Creatinine, Ser: 0.94 mg/dL (ref 0.44–1.00)
GFR calc Af Amer: >60 mL/min (ref >60)
GFR calc non Af Amer: >60 mL/min (ref >60)
Glucose, Bld: 103 mg/dL — ABNORMAL HIGH (ref 70–99)
Potassium: 4 mmol/L (ref 3.5–5.1)
Sodium: 139 mmol/L (ref 135–145)
Total Bilirubin: 0.5 mg/dL (ref 0.3–1.2)
Total Protein: 6.9 g/dL (ref 6.5–8.1)

## 2020-02-03 LAB — I-STAT CHEM 8, ED
BUN: 24 mg/dL — ABNORMAL HIGH (ref 6–20)
Calcium, Ion: 1.18 mmol/L (ref 1.15–1.40)
Chloride: 106 mmol/L (ref 98–111)
Creatinine, Ser: 1 mg/dL (ref 0.44–1.00)
Glucose, Bld: 96 mg/dL (ref 70–99)
HCT: 37 % (ref 36.0–46.0)
Hemoglobin: 12.6 g/dL (ref 12.0–15.0)
Potassium: 3.6 mmol/L (ref 3.5–5.1)
Sodium: 142 mmol/L (ref 135–145)
TCO2: 21 mmol/L — ABNORMAL LOW (ref 22–32)

## 2020-02-03 LAB — LIPASE, BLOOD: Lipase: 32 U/L (ref 11–51)

## 2020-02-03 MED ORDER — SODIUM CHLORIDE (PF) 0.9 % IJ SOLN
INTRAMUSCULAR | Status: AC
  Filled 2020-02-03: qty 50

## 2020-02-03 MED ORDER — CEPHALEXIN 500 MG PO CAPS
500.0000 mg | ORAL_CAPSULE | Freq: Two times a day (BID) | ORAL | 0 refills | Status: DC
Start: 2020-02-03 — End: 2021-02-16

## 2020-02-03 MED ORDER — POLYETHYLENE GLYCOL 3350 17 GM/SCOOP PO POWD
ORAL | 0 refills | Status: DC
Start: 2020-02-03 — End: 2021-02-16

## 2020-02-03 MED ORDER — IOHEXOL 300 MG/ML  SOLN
100.0000 mL | Freq: Once | INTRAMUSCULAR | Status: AC | PRN
  Administered 2020-02-03: 100 mL via INTRAVENOUS

## 2020-02-03 NOTE — ED Provider Notes (Signed)
[facility]-EMERGENCY DEPT Provider Note   CSN: [id number] Arrival date & time: 02/03/20  0151     History No chief complaint on file.   Nancy Wong is a 29 y.o. female.  Patient presents to the emergency department with a chief complaint of abdominal pain.  She states that she was awakened suddenly from sleep with severe abdominal pain that caused her to double over.  She states the pain was so severe it almost caused her to pass out.  She reports feeling very anxious.  She had an episode of vomiting.  She has never felt pain like this before, but states that it felt similar to an extreme contraction.  She is status post 4 weeks cesarean section.  She denies any fevers, chills.  She denies any diarrhea or constipation.  She had a normal bowel movement earlier this evening.  She denies any dysuria or hematuria.  She has not taken anything for her symptoms.  The history is provided by the patient. No language interpreter was used.       Past Medical History:  Diagnosis Date  . Anxiety   . Hypertension   . Hypothyroidism     Patient Active Problem List   Diagnosis Date Noted  . Cesarean delivery delivered 01/03/2020  . History of cesarean section 01/03/2020  . Physical exam 06/20/2018  . Preeclampsia 02/20/2018  . S/P cesarean section 02/20/2018  . Obesity (BMI 30-39.9) 11/03/2016  . Fatigue 11/03/2016  . Anxiety 11/03/2016    Past Surgical History:  Procedure Laterality Date  . CESAREAN SECTION N/A 02/20/2018   Procedure: CESAREAN SECTION;  Surgeon: Marcelle Overlie, MD;  Location: [facility] BIRTHING SUITES;  Service: Obstetrics;  Laterality: N/A;  . CESAREAN SECTION N/A 01/03/2020   Procedure: REPEAT CESAREAN SECTION;  Surgeon: Harold Hedge, MD;  Location: [facility] LD ORS;  Service: Obstetrics;  Laterality: N/A;  Tracey RNFA  . WISDOM TOOTH EXTRACTION       OB History    Gravida  2   Para  2   Term  1   Preterm  1   AB      Living  2      SAB      TAB      Ectopic      Multiple  0   Live Births  2           Family History  Problem Relation Age of Onset  . Cancer Mother        breast  . Kidney disease Father        had transplant- 2005  . Hypertension Father   . Healthy Brother   . Heart disease Maternal Grandfather   . Diabetes Maternal Grandfather   . Diabetes Paternal Grandfather   . Heart attack Paternal Grandfather   . Heart disease Paternal Grandfather     Social History   Tobacco Use  . Smoking status: Never Smoker  . Smokeless tobacco: Never Used  Substance Use Topics  . Alcohol use: No  . Drug use: No    Home Medications Prior to Admission medications   Medication Sig Start Date End Date Taking? Authorizing Provider  dextromethorphan (DELSYM) 30 MG/5ML liquid Take 10 mLs (60 mg total) by mouth 2 (two) times daily. 01/06/20   Morris, Aundra Millet, DO  ibuprofen (ADVIL) 600 MG tablet Take 1 tablet (600 mg total) by mouth every 6 (six) hours. 01/06/20   Morris, Aundra Millet, DO  oxyCODONE (OXY IR/ROXICODONE) 5 MG  immediate release tablet Take 1 tablet (5 mg total) by mouth every 4 (four) hours as needed for moderate pain. 01/06/20   Mitchel Honour, DO  Prenatal Vit-Fe Fumarate-FA (PRENATAL MULTIVITAMIN) TABS tablet Take 1 tablet by mouth daily at 12 noon.    [provider]    Allergies    Septra [sulfamethoxazole-trimethoprim]  Review of Systems   Review of Systems  All other systems reviewed and are negative.   Physical Exam Updated Vital Signs BP 130/89 (BP Location: Right Arm)   Pulse 88   Temp 98.7 F (37.1 C) (Oral)   Resp 16   Ht 5\' 3"  (1.6 m)   Wt 77.1 kg   SpO2 97%   BMI 30.11 kg/m   Physical Exam Vitals and nursing note reviewed.  Constitutional:      General: She is not in acute distress.    Appearance: She is well-developed.  HENT:     Head: Normocephalic and atraumatic.  Eyes:     Conjunctiva/sclera: Conjunctivae normal.  Cardiovascular:     Rate and Rhythm:  Normal rate and regular rhythm.     Heart sounds: No murmur heard.   Pulmonary:     Effort: Pulmonary effort is normal. No respiratory distress.     Breath sounds: Normal breath sounds.  Abdominal:     Palpations: Abdomen is soft.     Tenderness: There is abdominal tenderness.     Comments: Fairly significant periumbilical abdominal tenderness  Musculoskeletal:        General: Normal range of motion.     Cervical back: Neck supple.  Skin:    General: Skin is warm and dry.  Neurological:     Mental Status: She is alert and oriented to person, place, and time.  Psychiatric:        Mood and Affect: Mood normal.        Behavior: Behavior normal.     ED Results / Procedures / Treatments   Labs (all labs ordered are listed, but only abnormal results are displayed) Labs Reviewed  CBC WITH DIFFERENTIAL/PLATELET - Abnormal; Notable for the following components:      Result Value   WBC 11.1 (*)    Neutro Abs 8.1 (*)    All other components within normal limits  COMPREHENSIVE METABOLIC PANEL - Abnormal; Notable for the following components:   CO2 21 (*)    Glucose, Bld 103 (*)    BUN 24 (*)    All other components within normal limits  URINALYSIS, ROUTINE W REFLEX MICROSCOPIC - Abnormal; Notable for the following components:   Hgb urine dipstick SMALL (*)    Leukocytes,Ua MODERATE (*)    All other components within normal limits  I-STAT CHEM 8, ED - Abnormal; Notable for the following components:   BUN 24 (*)    TCO2 21 (*)    All other components within normal limits  LIPASE, BLOOD  I-STAT BETA HCG BLOOD, ED ([facility], [facility], [facility] ONLY)    EKG None  Radiology CT ABDOMEN PELVIS W CONTRAST  Result Date: 02/03/2020 CLINICAL DATA:  Abdominal pain starting earlier today. C-section 4 weeks ago. EXAM: CT ABDOMEN AND PELVIS WITH CONTRAST TECHNIQUE: Multidetector CT imaging of the abdomen and pelvis was performed using the standard protocol following bolus administration of intravenous  contrast. CONTRAST:  02/05/2020 OMNIPAQUE IOHEXOL 300 MG/ML  SOLN COMPARISON:  None. FINDINGS: Lower chest: Clear lung bases. Normal heart size without pericardial or pleural effusion. Hepatobiliary: Normal liver. Normal gallbladder, without biliary ductal  dilatation. Pancreas: Normal, without mass or ductal dilatation. Spleen: Normal in size, without focal abnormality. Adrenals/Urinary Tract: Normal adrenal glands. Normal kidneys, without hydronephrosis. Normal urinary bladder. Stomach/Bowel: Normal stomach, without wall thickening. Colonic stool burden suggests constipation. Normal terminal ileum. Normal appendix on coronal image 71 and transverse image 48. Normal small bowel. Vascular/Lymphatic: Normal caliber of the aorta and branch vessels. No abdominopelvic adenopathy. Reproductive: C-section defect. Otherwise normal uterus, slightly retroverted. No adnexal mass. Other: Trace fluid in the pelvis is likely physiologic and/or postoperative, including on 78/2. No free intraperitoneal air. No postoperative fluid collection within the anterior pelvic wall. Musculoskeletal: No acute osseous abnormality. IMPRESSION: 1. No acute process in the abdomen or pelvis. 2. Possible constipation. 3. Trace fluid in the pelvis is likely physiologic and/or postoperative. Electronically Signed   By: Jeronimo Greaves M.D.   On: 02/03/2020 04:36    Procedures Procedures (including critical care time)  Medications Ordered in ED Medications - No data to display  ED Course  I have reviewed the triage vital signs and the nursing notes.  Pertinent labs & imaging results that were available during my care of the patient were reviewed by me and considered in my medical decision making (see chart for details).    MDM Rules/Calculators/A&P                          Patient presents to the emergency department with abdominal pain.  She reports severe abdominal pain that awakened her from sleep.  The pain has since subsided  significantly.  She is status post 4 weeks cesarean section.  She denies any fevers.  She had an episode of vomiting.  She reports last bowel movement earlier today.  She does have some abdominal discomfort on my exam, will check CT and labs.  Laboratory work-up is reassuring.  CBC notable for mild leukocytosis at 11.1.  CMP shows no evidence of significant electrolyte derangement.  Lipase is normal.  Pregnancy test negative.  LFTs are normal.  Urinalysis shows moderate leukocytes and small hemoglobin with 6-10 white blood cells.  This could be consistent with cystitis.  I will treat patient with Keflex.  CT scan notable for no acute process in the abdomen or pelvis.  Questionable constipation.  Will give MiraLAX for constipation.  I discussed the results with the patient.  She is reassured.  She is well-appearing.  She is stable ready for discharge. Final Clinical Impression(s) / ED Diagnoses Final diagnoses:  Lower abdominal pain  Abnormal urinalysis  Constipation, unspecified constipation type    Rx / DC Orders ED Discharge Orders    None       Roxy Horseman, PA-C 02/03/20 0539    Ward, Layla Maw, DO 02/03/20 [phone]

## 2020-02-03 NOTE — ED Triage Notes (Addendum)
Pt. Came in from home with complained of abdominal pain w/c  started at  1245am today. Pt. Had C-section  4 weeks ago, pt. Stated that she went to see her OB 2 weeks ago after she noticed 3 sutures coming out from her post incision site , on the right side.  BM yesterday . Denied nausea  Nor diarrhea. Denied fever. Post incision site is dry and clean and intact, no redness noted around and on the site.

## 2020-02-03 NOTE — Discharge Instructions (Addendum)
Your blood work and CT scan were all fairly reassuring.  Your CT scan had some findings suggestive of possible constipation.  Your urinalysis had some abnormalities that can be consistent with cystitis or a bladder infection.  These 2 findings could contribute to the pain that you have experienced.  I recommend treatment with MiraLAX for constipation and have prescribed Keflex, an antibiotic for cystitis.  Please take as directed.  If you have new or worsening symptoms please return to the emergency department.  Otherwise you can follow-up with your regular doctor.

## 2020-02-14 DIAGNOSIS — Z309 Encounter for contraceptive management, unspecified: Secondary | ICD-10-CM | POA: Diagnosis not present

## 2020-02-14 DIAGNOSIS — Z01419 Encounter for gynecological examination (general) (routine) without abnormal findings: Secondary | ICD-10-CM | POA: Diagnosis not present

## 2020-02-14 DIAGNOSIS — Z1389 Encounter for screening for other disorder: Secondary | ICD-10-CM | POA: Diagnosis not present

## 2020-03-07 DIAGNOSIS — N649 Disorder of breast, unspecified: Secondary | ICD-10-CM | POA: Diagnosis not present

## 2020-06-13 DIAGNOSIS — N390 Urinary tract infection, site not specified: Secondary | ICD-10-CM | POA: Diagnosis not present

## 2020-06-19 DIAGNOSIS — F419 Anxiety disorder, unspecified: Secondary | ICD-10-CM | POA: Diagnosis not present

## 2020-06-19 DIAGNOSIS — R635 Abnormal weight gain: Secondary | ICD-10-CM | POA: Diagnosis not present

## 2020-06-19 DIAGNOSIS — N390 Urinary tract infection, site not specified: Secondary | ICD-10-CM | POA: Diagnosis not present

## 2020-06-19 DIAGNOSIS — Z6831 Body mass index (BMI) 31.0-31.9, adult: Secondary | ICD-10-CM | POA: Diagnosis not present

## 2020-11-07 IMAGING — CT CT ABD-PELV W/ CM
2 of 4 series · 16 of 46 positions shown, 18 images · IV contrast (omnipaque)
Comparison: None.

CLINICAL DATA: Abdominal pain starting earlier today. C-section 4
weeks ago.

EXAM:
CT ABDOMEN AND PELVIS WITH CONTRAST
TECHNIQUE: Multidetector CT imaging of the abdomen and pelvis was performed
using the standard protocol following bolus administration of
intravenous contrast.
CONTRAST:  100mL OMNIPAQUE IOHEXOL 300 MG/ML  SOLN

[Series 2: axial st · axial · 0.77mm/px · z∈[-516,-81]mm · 13 of 99 slices shown, 15 images]
[im 6/99  soft-tissue]
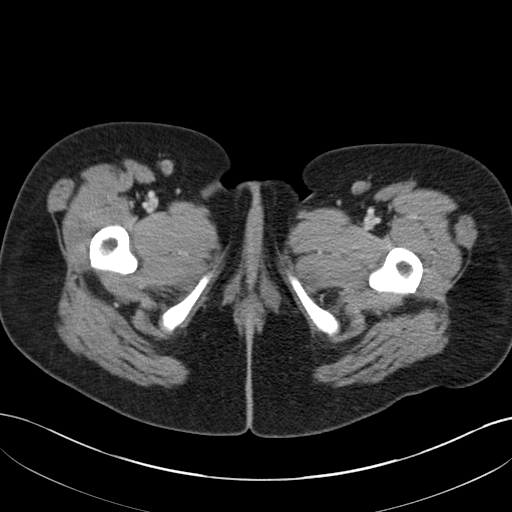
[im 6/99  bone]
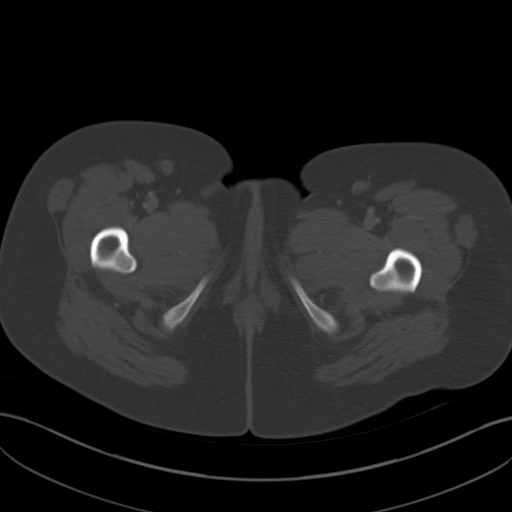
[im 11/99  soft-tissue]
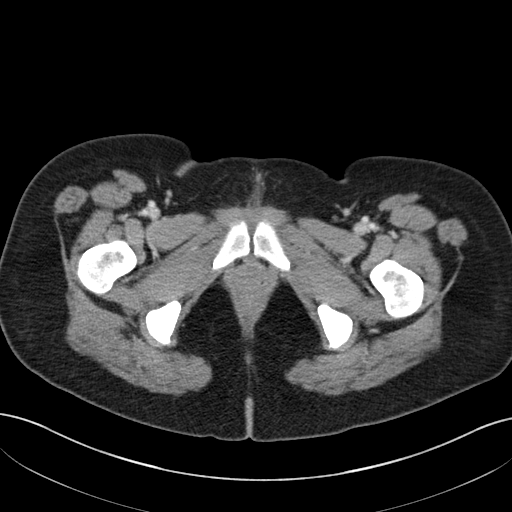
[im 22/99  soft-tissue]
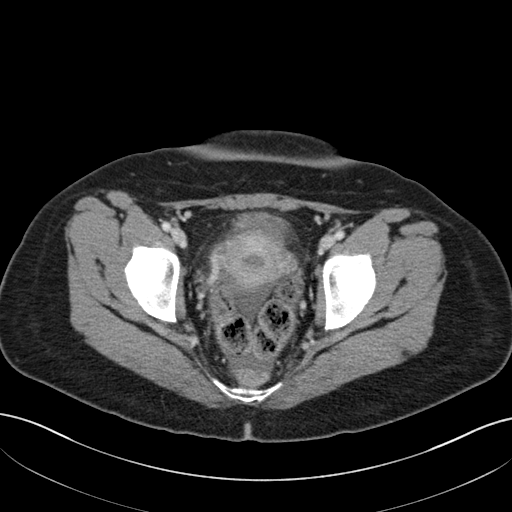
[im 28/99  soft-tissue]
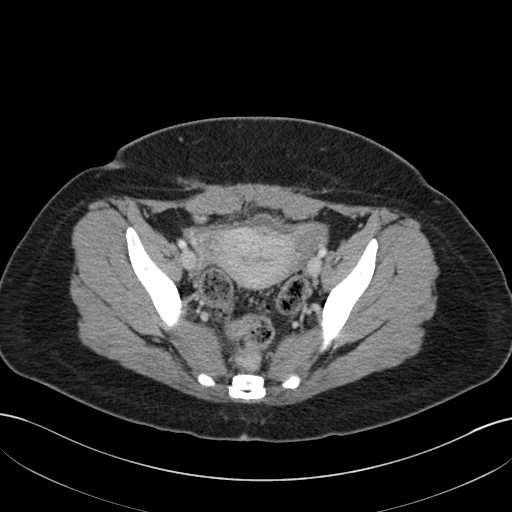
[im 33/99  soft-tissue]
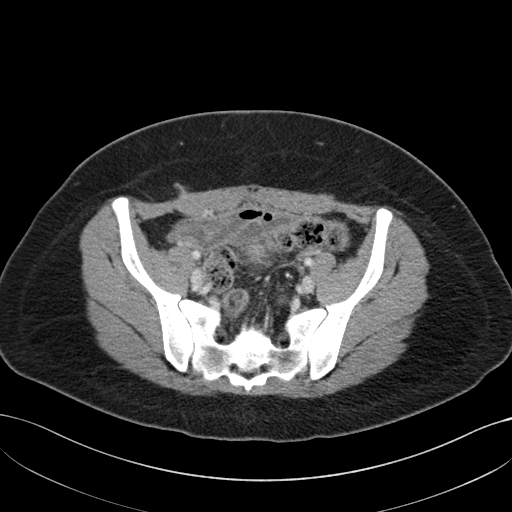
[im 44/99  soft-tissue]
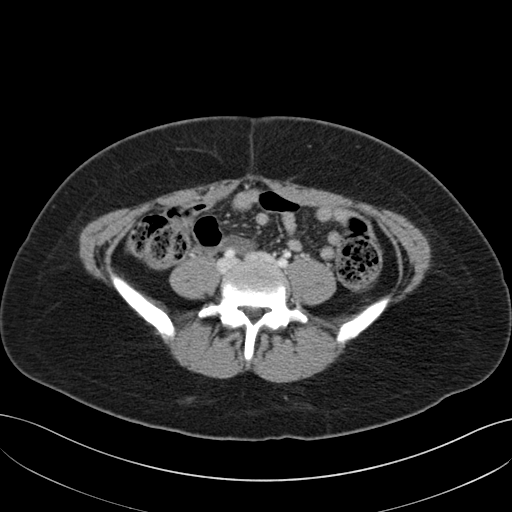
[im 50/99  soft-tissue]
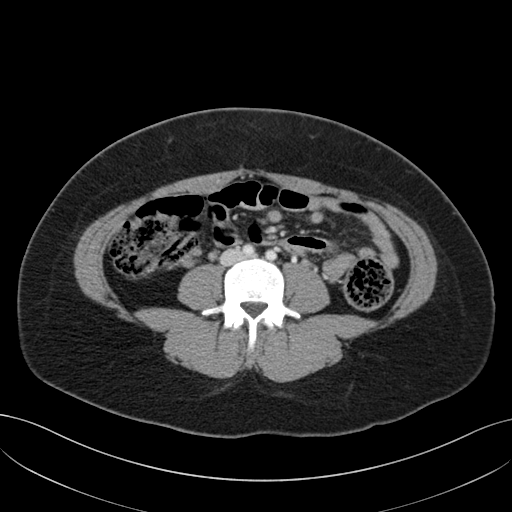
[im 55/99  soft-tissue]
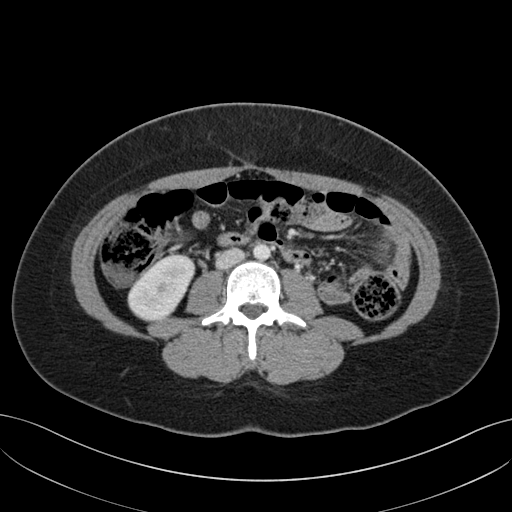
[im 66/99  soft-tissue]
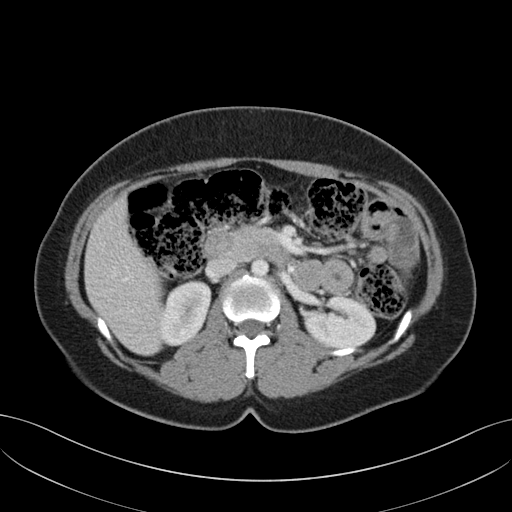
[im 66/99  bone]
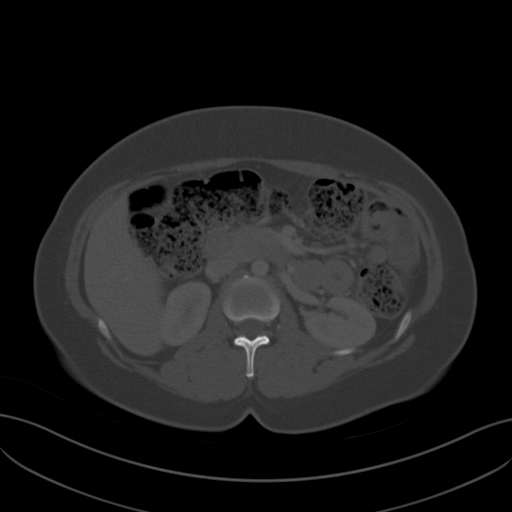
[im 71/99  soft-tissue]
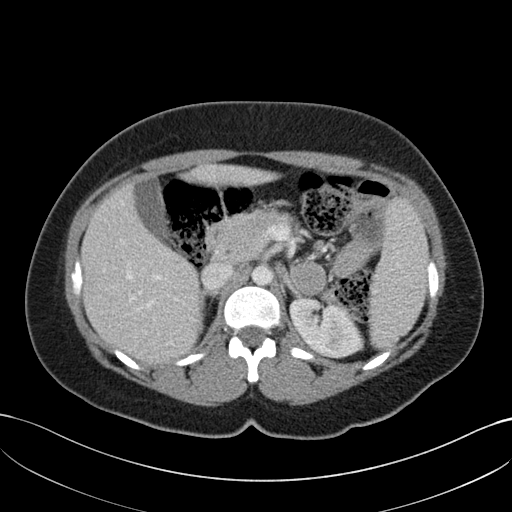
[im 77/99  soft-tissue]
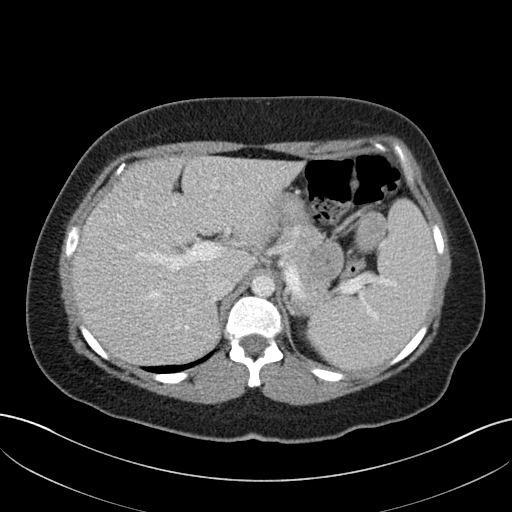
[im 88/99  soft-tissue]
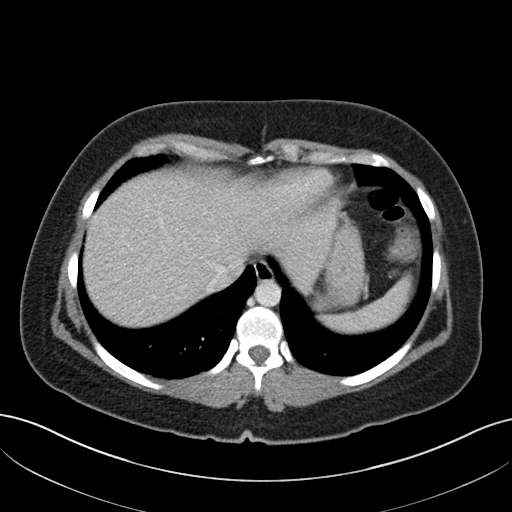
[im 93/99  soft-tissue]
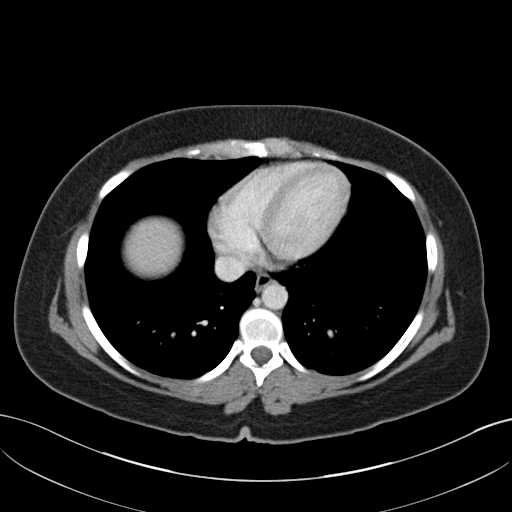

[Series 5: coronal st · coronal · 0.74mm/px · 3 of 142 slices shown]
[im 48/142  soft-tissue]
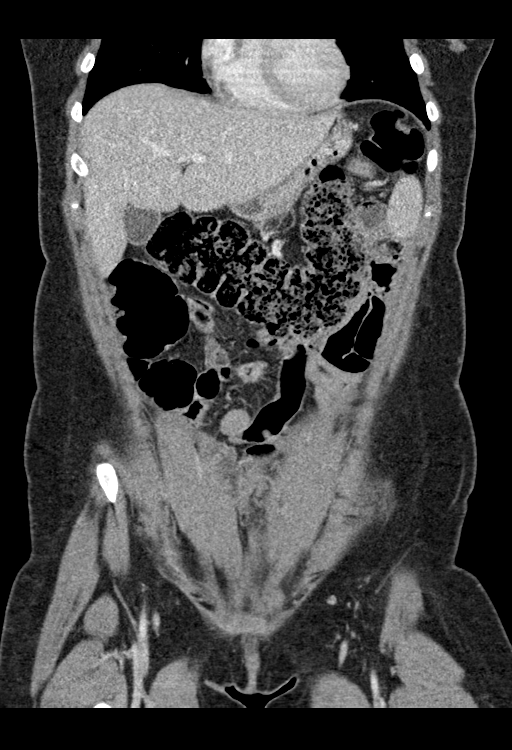
[im 63/142  soft-tissue]
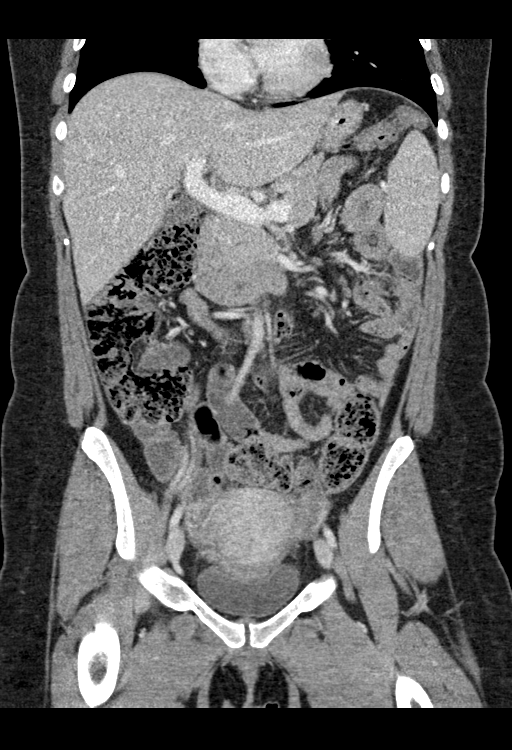
[im 79/142  soft-tissue]
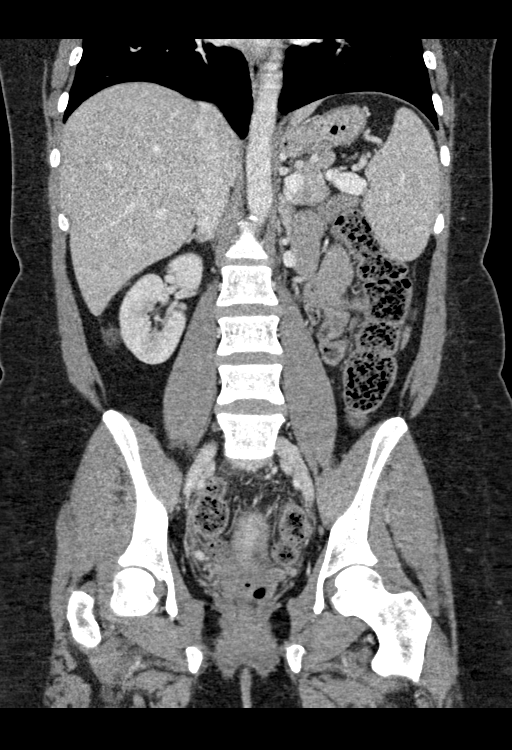

[16 of 46 positions shown; findings below may reference images not displayed]

FINDINGS: Lower chest: Clear lung bases. Normal heart size without pericardial
or pleural effusion.

Hepatobiliary: Normal liver. Normal gallbladder, without biliary
ductal dilatation.

Pancreas: Normal, without mass or ductal dilatation.

Spleen: Normal in size, without focal abnormality.

Adrenals/Urinary Tract: Normal adrenal glands. Normal kidneys,
without hydronephrosis. Normal urinary bladder.

Stomach/Bowel: Normal stomach, without wall thickening. Colonic
stool burden suggests constipation. Normal terminal ileum. Normal
appendix on coronal image 71 and transverse image 48.

Normal small bowel.

Vascular/Lymphatic: Normal caliber of the aorta and branch vessels.
No abdominopelvic adenopathy.

Reproductive: C-section defect. Otherwise normal uterus, slightly
retroverted. No adnexal mass.

Other: Trace fluid in the pelvis is likely physiologic and/or
postoperative, including on 78/2. No free intraperitoneal air. No
postoperative fluid collection within the anterior pelvic wall.

Musculoskeletal: No acute osseous abnormality.
IMPRESSION: 1. No acute process in the abdomen or pelvis.
2. Possible constipation.
3. Trace fluid in the pelvis is likely physiologic and/or
postoperative.

## 2020-12-24 ENCOUNTER — Telehealth (INDEPENDENT_AMBULATORY_CARE_PROVIDER_SITE_OTHER): Payer: BC Managed Care – PPO | Admitting: Family

## 2020-12-24 ENCOUNTER — Other Ambulatory Visit: Payer: Self-pay

## 2020-12-24 VITALS — Temp 101.8°F

## 2020-12-24 DIAGNOSIS — U071 COVID-19: Secondary | ICD-10-CM

## 2020-12-24 HISTORY — DX: COVID-19: U07.1

## 2020-12-24 MED ORDER — MOLNUPIRAVIR 200 MG PO CAPS: 800.0000 mg | ORAL_CAPSULE | Freq: Two times a day (BID) | ORAL | 0 refills | Status: AC

## 2020-12-24 NOTE — Assessment & Plan Note (Signed)
New. Will initiate Molnupiravir 800mg  bid x 5 days.  Discussed use of tylenol or motrin as needed for fever control.  Also advised of CDC guidelines for self isolation/ ending isolation.  Advised of safe practice guidelines. Symptom Tier reviewed.  Encouraged to monitor for any worsening symptoms; watch for increased shortness of breath, weakness, and signs of dehydration. Advised when to seek emergency care.  Instructed to rest and hydrate well.  Advised to leave the house during recommended isolation period, only if it is necessary to seek medical care.  Pt verbalizes understanding.

## 2020-12-24 NOTE — Progress Notes (Signed)
MyChart Video Visit    Virtual Visit via Video Note   This visit type was conducted due to national recommendations for restrictions regarding the COVID-19 Pandemic (e.g. social distancing) in an effort to limit this patient's exposure and mitigate transmission in our community. This patient is at least at moderate risk for complications without adequate follow up. This format is felt to be most appropriate for this patient at this time. Physical exam was limited by quality of the video and audio technology used for the visit. CMA was able to get the patient set up on a video visit.  Patient location: home Patient and provider in visit Provider location: Office  I discussed the limitations of evaluation and management by telemedicine and the availability of in person appointments. The patient expressed understanding and agreed to proceed.  Visit Date: 12/24/2020  Today's healthcare provider: Lemont Fillers, NP     Subjective:    Patient ID: Nancy Wong, female    DOB: Jan 17, 1991, 30 y.o.   MRN: [id number]  Chief Complaint  Patient presents with  . Covid Positive    Patient tested positive today  . Covid symptoms    Patient complains of headache, fever highest 102.5, fatigue and body aches     HPI Patient is in today for + COVID-19 infection.   Symptoms began yesterday- had severe HA.  Not sleeping well.  Had temp of 102.5- 101.8 today.  She as not tried any ibuprofen or acetaminophen. Father had a kidney transplant and was around him. He has notified his transplant team of the exposure.  She is unvaccinated against COVID.  She denies loss of taste/smell. Denies nausea/vomitting or diarrhea. + nasal congestion and slight cough. No sore throat. Has extreme fatigue.  She is not breastfeeding or pregnant at this time.   Past Medical History:  Diagnosis Date  . Anxiety   . Hypertension   . Hypothyroidism     Past Surgical History:  Procedure Laterality  Date  . CESAREAN SECTION N/A 02/20/2018   Procedure: CESAREAN SECTION;  Surgeon: Marcelle Overlie, MD;  Location: [facility];  Service: Obstetrics;  Laterality: N/A;  . CESAREAN SECTION N/A 01/03/2020   Procedure: REPEAT CESAREAN SECTION;  Surgeon: Harold Hedge, MD;  Location: [facility];  Service: Obstetrics;  Laterality: N/A;  Tracey RNFA  . WISDOM TOOTH EXTRACTION      Family History  Problem Relation Age of Onset  . Cancer Mother        breast  . Kidney disease Father        had transplant- 2005  . Hypertension Father   . Healthy Brother   . Heart disease Maternal Grandfather   . Diabetes Maternal Grandfather   . Diabetes Paternal Grandfather   . Heart attack Paternal Grandfather   . Heart disease Paternal Grandfather     Social History   Socioeconomic History  . Marital status: Married    Spouse name: Not on file  . Number of children: Not on file  . Years of education: Not on file  . Highest education level: Not on file  Occupational History  . Not on file  Tobacco Use  . Smoking status: Never Smoker  . Smokeless tobacco: Never Used  Substance and Sexual Activity  . Alcohol use: No  . Drug use: No  . Sexual activity: Yes  Other Topics Concern  . Not on file  Social History Narrative  . Not on file   Social Determinants  of Health   Financial Resource Strain: Not on file  Food Insecurity: Not on file  Transportation Needs: Not on file  Physical Activity: Not on file  Stress: Not on file  Social Connections: Not on file  Intimate Partner Violence: Not on file    Outpatient Medications Prior to Visit  Medication Sig Dispense Refill  . cephALEXin (KEFLEX) 500 MG capsule Take 1 capsule (500 mg total) by mouth 2 (two) times daily. 14 capsule 0  . dextromethorphan (DELSYM) 30 MG/5ML liquid Take 10 mLs (60 mg total) by mouth 2 (two) times daily. (Patient not taking: Reported on 02/03/2020) 89 mL 0  . ibuprofen (ADVIL) 200 MG tablet Take 400 mg by mouth  every 6 (six) hours as needed for fever, headache, mild pain, moderate pain or cramping.    Marland Kitchen ibuprofen (ADVIL) 600 MG tablet Take 1 tablet (600 mg total) by mouth every 6 (six) hours. (Patient not taking: Reported on 02/03/2020) 30 tablet 0  . Nutritional Supplements (JUICE PLUS FIBRE PO) Take 2 tablets by mouth daily.    Marland Kitchen oxyCODONE (OXY IR/ROXICODONE) 5 MG immediate release tablet Take 1 tablet (5 mg total) by mouth every 4 (four) hours as needed for moderate pain. (Patient not taking: Reported on 02/03/2020) 20 tablet 0  . polyethylene glycol powder (GLYCOLAX/MIRALAX) 17 GM/SCOOP powder Take one scoop daily until stools are soft and regular. 255 g 0  . Prenatal Vit-Fe Fumarate-FA (PRENATAL MULTIVITAMIN) TABS tablet Take 1 tablet by mouth daily at 12 noon.     No facility-administered medications prior to visit.    Allergies  Allergen Reactions  . Septra [Sulfamethoxazole-Trimethoprim] Hives    ROS     Objective:    Physical Exam Constitutional:      Appearance: She is ill-appearing.     Comments: Cheeks are very flushed  Pulmonary:     Effort: Pulmonary effort is normal.  Neurological:     Mental Status: She is alert.  Psychiatric:        Attention and Perception: Attention and perception normal.        Mood and Affect: Mood and affect normal.        Speech: Speech normal.        Behavior: Behavior normal.        Thought Content: Thought content normal.        Cognition and Memory: Cognition normal.     Temp (!) 101.8 F (38.8 C)  Wt Readings from Last 3 Encounters:  02/03/20 170 lb (77.1 kg)  01/02/20 185 lb (83.9 kg)  06/20/18 200 lb 6 oz (90.9 kg)     Assessment & Plan:   Problem List Items Addressed This Visit   None     I am having Nancy Wong maintain her prenatal multivitamin, ibuprofen, oxyCODONE, dextromethorphan, ibuprofen, Nutritional Supplements (JUICE PLUS FIBRE PO), cephALEXin, and polyethylene glycol powder.  No orders of the defined types  were placed in this encounter.   I discussed the assessment and treatment plan with the patient. The patient was provided an opportunity to ask questions and all were answered. The patient agreed with the plan and demonstrated an understanding of the instructions.   The patient was advised to call back or seek an in-person evaluation if the symptoms worsen or if the condition fails to improve as anticipated.  Lemont Fillers, NP [employer] [facility] [employer] [phone] (phone) [phone] (fax)  [facility]

## 2021-01-14 ENCOUNTER — Encounter: Payer: Self-pay | Admitting: *Deleted

## 2021-02-16 ENCOUNTER — Ambulatory Visit (INDEPENDENT_AMBULATORY_CARE_PROVIDER_SITE_OTHER): Payer: BC Managed Care – PPO | Admitting: Family

## 2021-02-16 ENCOUNTER — Other Ambulatory Visit: Payer: Self-pay

## 2021-02-16 ENCOUNTER — Other Ambulatory Visit (HOSPITAL_BASED_OUTPATIENT_CLINIC_OR_DEPARTMENT_OTHER): Payer: Self-pay

## 2021-02-16 ENCOUNTER — Encounter: Payer: Self-pay | Admitting: Family

## 2021-02-16 VITALS — BP 141/98 | HR 125 | Temp 98.8°F | Resp 16 | Wt 175.0 lb

## 2021-02-16 DIAGNOSIS — R002 Palpitations: Secondary | ICD-10-CM | POA: Diagnosis not present

## 2021-02-16 DIAGNOSIS — F419 Anxiety disorder, unspecified: Secondary | ICD-10-CM

## 2021-02-16 DIAGNOSIS — L309 Dermatitis, unspecified: Secondary | ICD-10-CM | POA: Insufficient documentation

## 2021-02-16 DIAGNOSIS — F32A Depression, unspecified: Secondary | ICD-10-CM

## 2021-02-16 DIAGNOSIS — R030 Elevated blood-pressure reading, without diagnosis of hypertension: Secondary | ICD-10-CM

## 2021-02-16 LAB — COMPREHENSIVE METABOLIC PANEL
ALT: 14 U/L (ref 0–35)
AST: 12 U/L (ref 0–37)
Albumin: 4.8 g/dL (ref 3.5–5.2)
Alkaline Phosphatase: 60 U/L (ref 39–117)
BUN: 15 mg/dL (ref 6–23)
CO2: 27 mEq/L (ref 19–32)
Calcium: 9.7 mg/dL (ref 8.4–10.5)
Chloride: 102 mEq/L (ref 96–112)
Creatinine, Ser: 0.87 mg/dL (ref 0.40–1.20)
GFR: 88.66 mL/min (ref >60.00)
Glucose, Bld: 86 mg/dL (ref 70–99)
Potassium: 4 mEq/L (ref 3.5–5.1)
Sodium: 136 mEq/L (ref 135–145)
Total Bilirubin: 0.4 mg/dL (ref 0.2–1.2)
Total Protein: 7.5 g/dL (ref 6.0–8.3)

## 2021-02-16 LAB — CBC WITH DIFFERENTIAL/PLATELET
Basophils Absolute: 0.1 10*3/uL (ref 0.0–0.1)
Basophils Relative: 0.7 % (ref 0.0–3.0)
Eosinophils Absolute: 0.1 10*3/uL (ref 0.0–0.7)
Eosinophils Relative: 1.5 % (ref 0.0–5.0)
HCT: 37.3 % (ref 36.0–46.0)
Hemoglobin: 12.6 g/dL (ref 12.0–15.0)
Lymphocytes Relative: 25.9 % (ref 12.0–46.0)
Lymphs Abs: 2.1 10*3/uL (ref 0.7–4.0)
MCHC: 33.8 g/dL (ref 30.0–36.0)
MCV: 84.6 fl (ref 78.0–100.0)
Monocytes Absolute: 0.4 10*3/uL (ref 0.1–1.0)
Monocytes Relative: 5 % (ref 3.0–12.0)
Neutro Abs: 5.5 10*3/uL (ref 1.4–7.7)
Neutrophils Relative %: 66.9 % (ref 43.0–77.0)
Platelets: 236 10*3/uL (ref 150.0–400.0)
RBC: 4.41 Mil/uL (ref 3.87–5.11)
RDW: 12.7 % (ref 11.5–15.5)
WBC: 8.2 10*3/uL (ref 4.0–10.5)

## 2021-02-16 LAB — TSH: TSH: 1.58 u[IU]/mL (ref 0.35–5.50)

## 2021-02-16 MED ORDER — SERTRALINE HCL 50 MG PO TABS
ORAL_TABLET | ORAL | 30 refills | Status: DC
Start: 2021-02-16 — End: 2021-03-13

## 2021-02-16 MED ORDER — ALPRAZOLAM 0.5 MG PO TABS
0.5000 mg | ORAL_TABLET | Freq: Two times a day (BID) | ORAL | 0 refills | Status: DC | PRN
Start: 2021-02-16 — End: 2021-04-06
  Filled 2021-02-16: qty 20, 10d supply, fill #0

## 2021-02-16 MED ORDER — BETAMETHASONE VALERATE 0.1 % EX OINT
1.0000 "application " | TOPICAL_OINTMENT | Freq: Two times a day (BID) | CUTANEOUS | 0 refills | Status: AC | PRN
  Filled 2021-02-16: qty 30, 15d supply, fill #0

## 2021-02-16 NOTE — Assessment & Plan Note (Signed)
Suspect related to anxiety. Will plan to recheck at her follow up visit in 3-4 weeks.

## 2021-02-16 NOTE — Patient Instructions (Signed)
Please begin sertraline (zoloft) 50mg  1/2 tab once daily for 1 week, then increase to a full tab once daily on week two.  Complete lab work prior to leaving. You may use xanax twice daily as needed for severe anxiety.

## 2021-02-16 NOTE — Assessment & Plan Note (Signed)
EKG tracing is personally reviewed.  EKG notes NSR.  No acute changes.   

## 2021-02-16 NOTE — Assessment & Plan Note (Signed)
Trial of diprolene cream 0.1% bid prn.

## 2021-02-16 NOTE — Assessment & Plan Note (Signed)
Uncontrolled. Will rx with sertraline 25mg  daily for 1 week then increase to 50mg  once daily on week two.  I gave her a short term supply of xanax for PRN use.  In addition, I gave her a number to call to get established with a counselor.

## 2021-02-16 NOTE — Progress Notes (Signed)
 Subjective:   By signing my name below, I, Shehryar Baig, attest that this documentation has been prepared under the direction and in the presence of Melissa O'Sullivan NP. 02/16/2021    Patient ID: Nancy Wong, female    DOB: 01/31/1991, 29 y.o.   MRN: [id number]  Chief Complaint  Patient presents with   Anxiety    Increased anxiety     HPI Patient is in today for a office visit. She is accompanied to this visit with her husband.  Anxiety- She complains of a anxiety and upset stomach since 2 months ago. Since then shortly developed dizziness and headaches and fatigue.  She notes intermittent palpitations. She was diagnosed with anxiety since being in college and was given Zoloft to manage her symptoms. She did well on sertraline but ultimately discontinued  6 months after her first child was born because she was doing well and did not need it.  She reports that her GYN started her on Wellbutrin after the birth of her second child. While taking Wellbutrin she developed dizziness and headache. She did not find much improvement in her anxiety symptoms on wellbutrin. She also notes she cannot take care of her children while her anxiety is acting up. She has taken sertraline  to manage her anxiety and reports doing well on it.  She is willing to try counseling and has reached out to someone but has not heard a response. She is willing to set up an appointment with another counselor. She prefers to set up an appointment with someone in [place], [place].  She reports that she does not work outside the home. She cares for her 2 small children and she and her family recently moved.   She is  requesting a thyroid screening.    She also reports some dry peeling skin on her fingertips which won't heal despite use of moisturizers.   Health Maintenance Due  Topic Date Due   COVID-19 Vaccine (1) Never done   Hepatitis C Screening  Never done   PAP SMEAR-Modifier  11/23/2019   INFLUENZA  VACCINE  02/16/2021    Past Medical History:  Diagnosis Date   Anxiety    COVID-19 virus infection 12/24/2020   History of cesarean section 01/03/2020   Hypertension    Hypothyroidism    S/P cesarean section 02/20/2018    Past Surgical History:  Procedure Laterality Date   CESAREAN SECTION N/A 02/20/2018   Procedure: CESAREAN SECTION;  Surgeon: Grewal, Michelle, MD;  Location: [facility];  Service: Obstetrics;  Laterality: N/A;   CESAREAN SECTION N/A 01/03/2020   Procedure: REPEAT CESAREAN SECTION;  Surgeon: Tomblin, James, MD;  Location: [facility] LD [facility];  Service: Obstetrics;  Laterality: N/A;  Tracey RNFA   WISDOM TOOTH EXTRACTION      Family History  Problem Relation Age of Onset   Cancer Mother        breast   Kidney disease Father        had transplant- 2005   Hypertension Father    Healthy Brother    Heart disease Maternal Grandfather    Diabetes Maternal Grandfather    Diabetes Paternal Grandfather    Heart attack Paternal Grandfather    Heart disease Paternal Grandfather     Social History   Socioeconomic History   Marital status: Married    Spouse name: Not on file   Number of children: Not on file   Years of education: Not on file   Highest education level:   Not on file  Occupational History   Not on file  Tobacco Use   Smoking status: Never   Smokeless tobacco: Never  Substance and Sexual Activity   Alcohol use: No   Drug use: No   Sexual activity: Yes  Other Topics Concern   Not on file  Social History Narrative   Not on file   Social Determinants of Health   Financial Resource Strain: Not on file  Food Insecurity: Not on file  Transportation Needs: Not on file  Physical Activity: Not on file  Stress: Not on file  Social Connections: Not on file  Intimate Partner Violence: Not on file    Outpatient Medications Prior to Visit  Medication Sig Dispense Refill   Prenatal Vit-Fe Fumarate-FA (PRENATAL MULTIVITAMIN) TABS tablet Take 1 tablet by  mouth daily at 12 noon.     cephALEXin (KEFLEX) 500 MG capsule Take 1 capsule (500 mg total) by mouth 2 (two) times daily. 14 capsule 0   dextromethorphan (DELSYM) 30 MG/5ML liquid Take 10 mLs (60 mg total) by mouth 2 (two) times daily. (Patient not taking: Reported on 02/03/2020) 89 mL 0   ibuprofen (ADVIL) 200 MG tablet Take 400 mg by mouth every 6 (six) hours as needed for fever, headache, mild pain, moderate pain or cramping.     ibuprofen (ADVIL) 600 MG tablet Take 1 tablet (600 mg total) by mouth every 6 (six) hours. (Patient not taking: Reported on 02/03/2020) 30 tablet 0   Nutritional Supplements (JUICE PLUS FIBRE PO) Take 2 tablets by mouth daily.     oxyCODONE (OXY IR/ROXICODONE) 5 MG immediate release tablet Take 1 tablet (5 mg total) by mouth every 4 (four) hours as needed for moderate pain. (Patient not taking: Reported on 02/03/2020) 20 tablet 0   polyethylene glycol powder (GLYCOLAX/MIRALAX) 17 GM/SCOOP powder Take one scoop daily until stools are soft and regular. 255 g 0   No facility-administered medications prior to visit.    Allergies  Allergen Reactions   Septra [Sulfamethoxazole-Trimethoprim] Hives    Review of Systems  Gastrointestinal:  Positive for abdominal pain (with anxiety).  Neurological:  Positive for dizziness (With anxiety) and headaches (with anxiety).  Psychiatric/Behavioral:  Positive for depression. The patient is nervous/anxious.       Objective:    Physical Exam Constitutional:      General: She is not in acute distress.    Appearance: Normal appearance. She is not ill-appearing.  HENT:     Head: Normocephalic and atraumatic.     Right Ear: External ear normal.     Left Ear: External ear normal.  Eyes:     Extraocular Movements: Extraocular movements intact.     Pupils: Pupils are equal, round, and reactive to light.  Pulmonary:     Effort: Pulmonary effort is normal.  Skin:    General: Skin is warm and dry.     Comments: Dry peeling skin on  several fingertips  Neurological:     Mental Status: She is alert and oriented to person, place, and time.  Psychiatric:        Attention and Perception: Attention normal.        Mood and Affect: Mood is anxious. Affect is tearful.        Speech: Speech normal.        Behavior: Behavior normal.    BP (!) 141/98 (BP Location: Right Arm, Patient Position: Sitting, Cuff Size: Small)   Pulse (!) 125   Temp 98.8 F (37.1  C) (Oral)   Resp 16   Wt 175 lb (79.4 kg)   SpO2 99%   BMI 31.00 kg/m  Wt Readings from Last 3 Encounters:  02/16/21 175 lb (79.4 kg)  02/03/20 170 lb (77.1 kg)  01/02/20 185 lb (83.9 kg)       Assessment & Plan:   Problem List Items Addressed This Visit       Unprioritized   Palpitations - Primary    EKG tracing is personally reviewed.  EKG notes NSR.  No acute changes.         Relevant Orders   Comp Met (CMET)   TSH   CBC with Differential/Platelet   EKG 12-Lead (Completed)   Elevated blood pressure reading    Suspect related to anxiety. Will plan to recheck at her follow up visit in 3-4 weeks.        Dermatitis    Trial of diprolene cream 0.1% bid prn.        Anxiety and depression    Uncontrolled. Will rx with sertraline 16m daily for 1 week then increase to 572monce daily on week two.  I gave her a short term supply of xanax for PRN use.  In addition, I gave her a number to call to get established with a counselor.        Relevant Medications   sertraline (ZOLOFT) 50 MG tablet   ALPRAZolam (XANAX) 0.5 MG tablet     Meds ordered this encounter  Medications   sertraline (ZOLOFT) 50 MG tablet    Sig: Take 1/2 tab by mouth nightly for 1 week, then increase to a full tablet on week two    Dispense:  30 tablet    Refill:  30    Order Specific Question:   Supervising Provider    Answer:   BLPenni Homans [[id number]]   ALPRAZolam (XANAX) 0.5 MG tablet    Sig: Take 1 tablet (0.5 mg total) by mouth 2 (two) times daily as needed for anxiety.     Dispense:  20 tablet    Refill:  0    Order Specific Question:   Supervising Provider    Answer:   BLPenni Homans [[id number]]   betamethasone valerate ointment (VALISONE) 0.1 %    Sig: Apply 1 application topically 2 (two) times daily as needed.    Dispense:  30 g    Refill:  0    Order Specific Question:   Supervising Provider    Answer:   BLPenni Homans [[id number]]    I, MeDebbrah AlarP, personally preformed the services described in this documentation.  All medical record entries made by the scribe were at my direction and in my presence.  I have reviewed the chart and discharge instructions (if applicable) and agree that the record reflects my personal performance and is accurate and complete. 02/16/2021   I,Shehryar Baig,acting as a scEducation administratoror MeNance PearNP.,have documented all relevant documentation on the behalf of MeNance PearNP,as directed by  MeNance PearNP while in the presence of MeNance PearNP.   MeNance PearNP

## 2021-02-25 ENCOUNTER — Other Ambulatory Visit (HOSPITAL_BASED_OUTPATIENT_CLINIC_OR_DEPARTMENT_OTHER): Payer: Self-pay

## 2021-03-13 ENCOUNTER — Other Ambulatory Visit: Payer: Self-pay

## 2021-03-13 ENCOUNTER — Ambulatory Visit (INDEPENDENT_AMBULATORY_CARE_PROVIDER_SITE_OTHER): Payer: BC Managed Care – PPO | Admitting: Family

## 2021-03-13 DIAGNOSIS — R002 Palpitations: Secondary | ICD-10-CM

## 2021-03-13 DIAGNOSIS — R030 Elevated blood-pressure reading, without diagnosis of hypertension: Secondary | ICD-10-CM | POA: Diagnosis not present

## 2021-03-13 DIAGNOSIS — F419 Anxiety disorder, unspecified: Secondary | ICD-10-CM | POA: Diagnosis not present

## 2021-03-13 DIAGNOSIS — F32A Depression, unspecified: Secondary | ICD-10-CM | POA: Diagnosis not present

## 2021-03-13 DIAGNOSIS — L309 Dermatitis, unspecified: Secondary | ICD-10-CM | POA: Diagnosis not present

## 2021-03-13 MED ORDER — SERTRALINE HCL 100 MG PO TABS: 100.0000 mg | ORAL_TABLET | Freq: Every day | ORAL | 0 refills | Status: DC

## 2021-03-13 NOTE — Assessment & Plan Note (Signed)
BP Readings from Last 3 Encounters:  03/13/21 134/71  02/16/21 (!) 141/98  02/03/20 135/75   BP looks much better today. Monitor.

## 2021-03-13 NOTE — Assessment & Plan Note (Signed)
Improved. She did not start steroid cream.  Monitor.

## 2021-03-13 NOTE — Assessment & Plan Note (Addendum)
Wt Readings from Last 3 Encounters:  03/13/21 175 lb (79.4 kg)  02/16/21 175 lb (79.4 kg)  02/03/20 170 lb (77.1 kg)   Improving but still not at goal. Will increase zoloft from 50mg  to 100mg . Continue xanax prn. A controlled substance contract is signed today and pt will complete UDS.  She is also encouraged to follow up with the counselor.

## 2021-03-13 NOTE — Progress Notes (Signed)
Subjective:   By signing my name below, I, Shehryar Baig, attest that this documentation has been prepared under the direction and in the presence of Sandford Craze NP. 03/13/2021    Patient ID: Nancy Wong, female    DOB: Oct 30, 1990, 30 y.o.   MRN: [id number]  Chief Complaint  Patient presents with   Anxiety    Follow up after starting medications   Depression    Follow up    HPI Patient is in today for a office visit. Her husband is present during this visit.   Anxiety- During last visit she complained of anxiety. She started taking 50 mg Zoloft daily PO and reports mild improvement. She continues having anxiety when going into a car and doctors offices. She reports taking 0.5 xanax PRN before coming to this visit. She notes that xanax helps her symptoms but loses effects too fast before her anxiety comes back. She reports having an upcomming vacation to [place] that is causing her great amount of anxiety due to getting on an airplane. She is insistent on going still. She recently started counseling and plans on continuing sessions.  Rash- She reports the dry skin on her fingers have improved since her last visit. She has not started applying 0.1% Valisone cream to her rash yet.  Palpitations- She reports no recent episodes of palpitations.  Blood pressure- Her blood pressure is doing well during this visit.   BP Readings from Last 3 Encounters:  03/13/21 134/71  02/16/21 (!) 141/98  02/03/20 135/75   Weight- She has not gained weight since her last visit. She reports improving her diet and is planning on participating in regular exercise.   Wt Readings from Last 3 Encounters:  03/13/21 175 lb (79.4 kg)  02/16/21 175 lb (79.4 kg)  02/03/20 170 lb (77.1 kg)    Health Maintenance Due  Topic Date Due   COVID-19 Vaccine (1) Never done   Hepatitis C Screening  Never done   PAP SMEAR-Modifier  11/23/2019   INFLUENZA VACCINE  02/16/2021    Past Medical  History:  Diagnosis Date   Anxiety    COVID-19 virus infection 12/24/2020   History of cesarean section 01/03/2020   Hypertension    Hypothyroidism    S/P cesarean section 02/20/2018    Past Surgical History:  Procedure Laterality Date   CESAREAN SECTION N/A 02/20/2018   Procedure: CESAREAN SECTION;  Surgeon: Marcelle Overlie, MD;  Location: [facility]IRTH[facility] SU[facility];  Service: Obstetrics;  Laterality: N/A;   CESAREAN SECTION N/A 01/03/2020   Procedure: REPEAT CESAREAN SECTION;  Surgeon: Harold Hedge, MD;  Location: [facility] LD ORS;  Service: Obstetrics;  Laterality: N/A;  Tracey RNFA   WISDOM TOOTH EXTRACTION      Family History  Problem Relation Age of Onset   Cancer Mother        breast   Kidney disease Father        had transplant- 2005   Hypertension Father    Healthy Brother    Heart disease Maternal Grandfather    Diabetes Maternal Grandfather    Diabetes Paternal Grandfather    Heart attack Paternal Grandfather    Heart disease Paternal Grandfather     Social History   Socioeconomic History   Marital status: Married    Spouse name: Not on file   Number of children: Not on file   Years of education: Not on file   Highest education level: Not on file  Occupational History   Not on  file  Tobacco Use   Smoking status: Never   Smokeless tobacco: Never  Substance and Sexual Activity   Alcohol use: No   Drug use: No   Sexual activity: Yes  Other Topics Concern   Not on file  Social History Narrative   Not on file   Social Determinants of Health   Financial Resource Strain: Not on file  Food Insecurity: Not on file  Transportation Needs: Not on file  Physical Activity: Not on file  Stress: Not on file  Social Connections: Not on file  Intimate Partner Violence: Not on file    Outpatient Medications Prior to Visit  Medication Sig Dispense Refill   ALPRAZolam (XANAX) 0.5 MG tablet Take 1 tablet (0.5 mg total) by mouth 2 (two) times daily as needed for anxiety. 20  tablet 0   betamethasone valerate ointment (VALISONE) 0.1 % Apply 1 application topically 2 (two) times daily as needed. 30 g 0   Prenatal Vit-Fe Fumarate-FA (PRENATAL MULTIVITAMIN) TABS tablet Take 1 tablet by mouth daily at 12 noon.     sertraline (ZOLOFT) 50 MG tablet Take 1/2 tab by mouth nightly for 1 week, then increase to a full tablet on week two 30 tablet 30   No facility-administered medications prior to visit.    Allergies  Allergen Reactions   Septra [Sulfamethoxazole-Trimethoprim] Hives    Review of Systems  Constitutional:  Diaphoresis: fingertips bilateral.  Skin:  Positive for rash (On fingers).  Psychiatric/Behavioral:  The patient is nervous/anxious.        (+)tearful during visit      Objective:    Physical Exam Constitutional:      General: She is not in acute distress.    Appearance: Normal appearance. She is not ill-appearing.  HENT:     Head: Normocephalic and atraumatic.     Right Ear: External ear normal.     Left Ear: External ear normal.  Eyes:     Extraocular Movements: Extraocular movements intact.     Pupils: Pupils are equal, round, and reactive to light.  Cardiovascular:     Rate and Rhythm: Normal rate and regular rhythm.     Heart sounds: Normal heart sounds. No murmur heard.   No gallop.  Pulmonary:     Effort: Pulmonary effort is normal. No respiratory distress.     Breath sounds: Normal breath sounds. No wheezing or rales.  Skin:    General: Skin is warm and dry.     Comments: Dry skin on bilateral fingertips  Neurological:     Mental Status: She is alert and oriented to person, place, and time.  Psychiatric:        Mood and Affect: Affect is tearful.        Behavior: Behavior normal.    BP 134/71 (BP Location: Right Arm, Patient Position: Sitting, Cuff Size: Small)   Pulse 74   Temp 98.3 F (36.8 C) (Oral)   Resp 16   Wt 175 lb (79.4 kg)   SpO2 100%   BMI 31.00 kg/m  Wt Readings from Last 3 Encounters:  03/13/21 175 lb  (79.4 kg)  02/16/21 175 lb (79.4 kg)  02/03/20 170 lb (77.1 kg)       Assessment & Plan:   Problem List Items Addressed This Visit       Unprioritized   Palpitations    Improved. Likely related to her anxiety. Monitor. CMET, TSH, CBC were all WNL last visit.      Elevated blood  pressure reading    BP Readings from Last 3 Encounters:  03/13/21 134/71  02/16/21 (!) 141/98  02/03/20 135/75  BP looks much better today. Monitor.       Dermatitis    Improved. She did not start steroid cream.  Monitor.       Anxiety and depression    Wt Readings from Last 3 Encounters:  03/13/21 175 lb (79.4 kg)  02/16/21 175 lb (79.4 kg)  02/03/20 170 lb (77.1 kg)  Improving but still not at goal. Will increase zoloft from 50mg  to 100mg . Continue xanax prn. A controlled substance contract is signed today and pt will complete UDS.  She is also encouraged to follow up with the counselor.         Relevant Medications   sertraline (ZOLOFT) 100 MG tablet   Other Relevant Orders   DRUG MONITORING, PANEL 8 WITH CONFIRMATION, URINE     Meds ordered this encounter  Medications   sertraline (ZOLOFT) 100 MG tablet    Sig: Take 1 tablet (100 mg total) by mouth daily.    Dispense:  90 tablet    Refill:  0    Order Specific Question:   Supervising Provider    Answer:   A [[id number]]    I, NP, personally preformed the services described in this documentation.  All medical record entries made by the scribe were at my direction and in my presence.  I have reviewed the chart and discharge instructions (if applicable) and agree that the record reflects my personal performance and is accurate and complete. 03/13/2021   I,Shehryar Baig,acting as a Sandford Craze for 03/15/2021, NP.,have documented all relevant documentation on the behalf of Neurosurgeon, NP,as directed by  Lemont Fillers, NP while in the presence of Lemont Fillers, NP.   Lemont Fillers, NP

## 2021-03-13 NOTE — Assessment & Plan Note (Signed)
Improved. Likely related to her anxiety. Monitor. CMET, TSH, CBC were all WNL last visit.

## 2021-03-14 LAB — DRUG MONITORING, PANEL 8 WITH CONFIRMATION, URINE
6 Acetylmorphine: NEGATIVE ng/mL (ref <10)
Alcohol Metabolites: NEGATIVE ng/mL (ref <500)
Amphetamines: NEGATIVE ng/mL (ref <500)
Benzodiazepines: NEGATIVE ng/mL (ref <100)
Buprenorphine, Urine: NEGATIVE ng/mL (ref <5)
Cocaine Metabolite: NEGATIVE ng/mL (ref <150)
Creatinine: 94.4 mg/dL (ref >=20.0)
MDMA: NEGATIVE ng/mL (ref <500)
Marijuana Metabolite: NEGATIVE ng/mL (ref <20)
Opiates: NEGATIVE ng/mL (ref <100)
Oxidant: NEGATIVE ug/mL (ref <200)
Oxycodone: NEGATIVE ng/mL (ref <100)
pH: 6 (ref 4.5–9.0)

## 2021-03-14 LAB — DM TEMPLATE

## 2021-04-06 ENCOUNTER — Other Ambulatory Visit: Payer: Self-pay

## 2021-04-06 ENCOUNTER — Ambulatory Visit (INDEPENDENT_AMBULATORY_CARE_PROVIDER_SITE_OTHER): Payer: BC Managed Care – PPO | Admitting: Family

## 2021-04-06 VITALS — BP 133/81 | HR 60 | Temp 98.2°F | Resp 16 | Wt 173.0 lb

## 2021-04-06 DIAGNOSIS — F419 Anxiety disorder, unspecified: Secondary | ICD-10-CM | POA: Diagnosis not present

## 2021-04-06 DIAGNOSIS — R197 Diarrhea, unspecified: Secondary | ICD-10-CM

## 2021-04-06 DIAGNOSIS — R030 Elevated blood-pressure reading, without diagnosis of hypertension: Secondary | ICD-10-CM

## 2021-04-06 DIAGNOSIS — F32A Depression, unspecified: Secondary | ICD-10-CM | POA: Diagnosis not present

## 2021-04-06 MED ORDER — ALPRAZOLAM 0.5 MG PO TABS: 0.5000 mg | ORAL_TABLET | Freq: Two times a day (BID) | ORAL | 0 refills | Status: DC | PRN

## 2021-04-06 MED ORDER — SERTRALINE HCL 100 MG PO TABS: 100.0000 mg | ORAL_TABLET | Freq: Every day | ORAL | 2 refills | Status: DC

## 2021-04-06 NOTE — Assessment & Plan Note (Signed)
Much improved on zoloft 100mg  once daily.  Continue along with prn xanax.  Recommended that she also consider adding a lavender supplement such as Calm Aid PO once daily.

## 2021-04-06 NOTE — Progress Notes (Signed)
Subjective:   By signing my name below, I, Shehryar Baig, attest that this documentation has been prepared under the direction and in the presence of Sandford Craze NP. 04/06/2021    Patient ID: Nancy Wong, female    DOB: 1990-08-29, 30 y.o.   MRN: [id number]  Chief Complaint  Patient presents with   Hypertension    Here for follow up   Anxiety    Here for follow up   Diarrhea    Complains of diarrhea since 04-02-21    HPI Patient is in today for a office visit.  Anxiety/Diarrhea- During last visit her Zoloft prescription was increased to 100 mg to manage her anxiety. She started taking the increased dosage on Wednesday night. She notes developing liquid diarrhea since Thursday that persists at this time. She has not taken imodium to manage her symptoms because she thinks it is a bacteria issue.  She continues taking 0.5 mg xanax daily PRN and reports taking 1-2x weekly to manage her anxiety.  She reports having a trip planned during this first week of October and has made it her goal to improve her anxiety before her trip. She notes that she is anxious for the plane ride and is planning to take xanax to manage it during that time.  She reports recently trying tapping therapy to control her anxiety.  She has previously taken 100 mg Zoloft daily PO and reports doing well while taking it. She has also taken xanax previously to manage her anxiety.    Health Maintenance Due  Topic Date Due   COVID-19 Vaccine (1) Never done   Hepatitis C Screening  Never done   PAP SMEAR-Modifier  11/23/2019   INFLUENZA VACCINE  02/16/2021    Past Medical History:  Diagnosis Date   Anxiety    COVID-19 virus infection 12/24/2020   History of cesarean section 01/03/2020   Hypertension    Hypothyroidism    S/P cesarean section 02/20/2018    Past Surgical History:  Procedure Laterality Date   CESAREAN SECTION N/A 02/20/2018   Procedure: CESAREAN SECTION;  Surgeon: Marcelle Overlie,  MD;  Location: [facility]TH[facility] SU[facility];  Service: Obstetrics;  Laterality: N/A;   CESAREAN SECTION N/A 01/03/2020   Procedure: REPEAT CESAREAN SECTION;  Surgeon: Harold Hedge, MD;  Location: [facility];  Service: Obstetrics;  Laterality: N/A;  Tracey RNFA   WISDOM TOOTH EXTRACTION      Family History  Problem Relation Age of Onset   Cancer Mother        breast   Kidney disease Father        had transplant- 2005   Hypertension Father    Healthy Brother    Heart disease Maternal Grandfather    Diabetes Maternal Grandfather    Diabetes Paternal Grandfather    Heart attack Paternal Grandfather    Heart disease Paternal Grandfather     Social History   Socioeconomic History   Marital status: Married    Spouse name: Not on file   Number of children: Not on file   Years of education: Not on file   Highest education level: Not on file  Occupational History   Not on file  Tobacco Use   Smoking status: Never   Smokeless tobacco: Never  Substance and Sexual Activity   Alcohol use: No   Drug use: No   Sexual activity: Yes  Other Topics Concern   Not on file  Social History Narrative   Not on file  Social Determinants of Health   Financial Resource Strain: Not on file  Food Insecurity: Not on file  Transportation Needs: Not on file  Physical Activity: Not on file  Stress: Not on file  Social Connections: Not on file  Intimate Partner Violence: Not on file    Outpatient Medications Prior to Visit  Medication Sig Dispense Refill   betamethasone valerate ointment (VALISONE) 0.1 % Apply 1 application topically 2 (two) times daily as needed. 30 g 0   Prenatal Vit-Fe Fumarate-FA (PRENATAL MULTIVITAMIN) TABS tablet Take 1 tablet by mouth daily at 12 noon.     ALPRAZolam (XANAX) 0.5 MG tablet Take 1 tablet (0.5 mg total) by mouth 2 (two) times daily as needed for anxiety. 20 tablet 0   sertraline (ZOLOFT) 100 MG tablet Take 1 tablet (100 mg total) by mouth daily. 90 tablet 0   No  facility-administered medications prior to visit.    Allergies  Allergen Reactions   Septra [Sulfamethoxazole-Trimethoprim] Hives    Review of Systems  Gastrointestinal:  Positive for diarrhea (Liquid diarrhea).  Psychiatric/Behavioral:  The patient is nervous/anxious.       Objective:    Physical Exam Constitutional:      General: She is not in acute distress.    Appearance: Normal appearance. She is not ill-appearing.  HENT:     Head: Normocephalic and atraumatic.     Right Ear: External ear normal.     Left Ear: External ear normal.  Eyes:     Extraocular Movements: Extraocular movements intact.     Pupils: Pupils are equal, round, and reactive to light.  Cardiovascular:     Rate and Rhythm: Normal rate and regular rhythm.     Heart sounds: Normal heart sounds. No murmur heard.   No gallop.  Pulmonary:     Effort: Pulmonary effort is normal. No respiratory distress.     Breath sounds: Normal breath sounds. No wheezing or rales.  Skin:    General: Skin is warm and dry.  Neurological:     Mental Status: She is alert and oriented to person, place, and time.  Psychiatric:        Behavior: Behavior normal.    BP 133/81 (BP Location: Right Arm, Patient Position: Sitting, Cuff Size: Small)   Pulse 60   Temp 98.2 F (36.8 C) (Oral)   Resp 16   Wt 173 lb (78.5 kg)   SpO2 100%   BMI 30.65 kg/m  Wt Readings from Last 3 Encounters:  04/06/21 173 lb (78.5 kg)  03/13/21 175 lb (79.4 kg)  02/16/21 175 lb (79.4 kg)       Assessment & Plan:   Problem List Items Addressed This Visit       Unprioritized   Elevated blood pressure reading    BP Readings from Last 3 Encounters:  04/06/21 133/81  03/13/21 134/71  02/16/21 (!) 141/98  BP is stable/improved. Was likely related to anxiety.       Diarrhea    This correlates with her increase in zoloft dose from 50mg  to 100mg .  Advised pt OK to use prn imodium and to let me know if not improved in 1 week.       Anxiety and depression - Primary    Much improved on zoloft 100mg  once daily.  Continue along with prn xanax.  Recommended that she also consider adding a lavender supplement such as Calm Aid PO once daily.       Relevant Medications   sertraline (  ZOLOFT) 100 MG tablet   ALPRAZolam (XANAX) 0.5 MG tablet     Meds ordered this encounter  Medications   sertraline (ZOLOFT) 100 MG tablet    Sig: Take 1 tablet (100 mg total) by mouth daily.    Dispense:  30 tablet    Refill:  2    Order Specific Question:   Supervising Provider    Answer:   Danise Edge A [[id number]]   ALPRAZolam (XANAX) 0.5 MG tablet    Sig: Take 1 tablet (0.5 mg total) by mouth 2 (two) times daily as needed for anxiety.    Dispense:  20 tablet    Refill:  0    Order Specific Question:   Supervising Provider    Answer:   Danise Edge A [[id number]]    I, Sandford Craze NP, personally preformed the services described in this documentation.  All medical record entries made by the scribe were at my direction and in my presence.  I have reviewed the chart and discharge instructions (if applicable) and agree that the record reflects my personal performance and is accurate and complete. 04/06/2021   I,Shehryar Baig,acting as a Neurosurgeon for Lemont Fillers, NP.,have documented all relevant documentation on the behalf of Lemont Fillers, NP,as directed by  Lemont Fillers, NP while in the presence of Lemont Fillers, NP.   Lemont Fillers, NP

## 2021-04-06 NOTE — Assessment & Plan Note (Signed)
This correlates with her increase in zoloft dose from 50mg  to 100mg .  Advised pt OK to use prn imodium and to let me know if not improved in 1 week.

## 2021-04-06 NOTE — Assessment & Plan Note (Signed)
BP Readings from Last 3 Encounters:  04/06/21 133/81  03/13/21 134/71  02/16/21 (!) 141/98   BP is stable/improved. Was likely related to anxiety.

## 2021-04-10 ENCOUNTER — Telehealth: Payer: Self-pay | Admitting: Family

## 2021-04-10 DIAGNOSIS — R197 Diarrhea, unspecified: Secondary | ICD-10-CM

## 2021-04-10 NOTE — Telephone Encounter (Signed)
I double checked using drug interaction software and there is no known drug interaction between the two medications.  I think it is OK for her to take imodium as needed for now.

## 2021-04-10 NOTE — Telephone Encounter (Signed)
Spoke w/ Pt- informed of recommendations, she will come by office to pick up the stool collection containers- she did mention that her pharmacist told her she could not mix immodium and sertraline d/t medication interaction. Informed I wasn't sure but I'd send another message to PCP to find out.

## 2021-04-10 NOTE — Telephone Encounter (Signed)
Spoke w/ Pt- informed of recommendations. Pt verbalized understanding.  

## 2021-04-10 NOTE — Telephone Encounter (Signed)
Pt called she was seen on 04/06/2021 for a f/u appt. Pt stated she is still having diarrhea issues. She would like some advice on what meds or suggestions that would help her. Please advise.

## 2021-04-10 NOTE — Telephone Encounter (Signed)
I would recommend immodium prn and that she complete a stool specimen to check for infection in her GI tract. Order has been placed for GI profile.

## 2021-04-15 NOTE — Telephone Encounter (Signed)
Patient is calling to know if it's possible for her to pick up the stool collection containers from an office in [place] since it is closer to her. She states that our office is 25 minutes away and it's been hard for her to come and pick it up with her two kids. She states that there is a Adult nurse at [facility] that is close to her where she could pick up the containers. Please advise.

## 2021-04-16 ENCOUNTER — Other Ambulatory Visit: Payer: Self-pay

## 2021-04-16 ENCOUNTER — Other Ambulatory Visit: Payer: BC Managed Care – PPO

## 2021-04-16 DIAGNOSIS — R197 Diarrhea, unspecified: Secondary | ICD-10-CM

## 2021-04-20 ENCOUNTER — Telehealth: Payer: Self-pay | Admitting: Family

## 2021-04-20 LAB — GI PROFILE, STOOL, PCR

## 2021-04-20 NOTE — Telephone Encounter (Signed)
Patient is calling back regarding meds. She wants to know if meds can be mixed. Please advise.

## 2021-04-20 NOTE — Telephone Encounter (Signed)
Patient calling wanting advise on how to take her meds for an upcoming trip. She wants to know about taking Xanax & dramamine   Xanax and Benergly Xanax & Imodium.

## 2021-04-21 NOTE — Telephone Encounter (Signed)
See mychart.  

## 2021-04-21 NOTE — Telephone Encounter (Signed)
Left message on machine to call back  

## 2021-05-14 DIAGNOSIS — F418 Other specified anxiety disorders: Secondary | ICD-10-CM | POA: Diagnosis not present

## 2021-05-14 DIAGNOSIS — N649 Disorder of breast, unspecified: Secondary | ICD-10-CM | POA: Diagnosis not present

## 2021-05-14 DIAGNOSIS — R5383 Other fatigue: Secondary | ICD-10-CM | POA: Diagnosis not present

## 2021-06-19 DIAGNOSIS — Z13220 Encounter for screening for lipoid disorders: Secondary | ICD-10-CM | POA: Diagnosis not present

## 2021-06-19 DIAGNOSIS — Z0130 Encounter for examination of blood pressure without abnormal findings: Secondary | ICD-10-CM | POA: Diagnosis not present

## 2021-06-19 DIAGNOSIS — Z713 Dietary counseling and surveillance: Secondary | ICD-10-CM | POA: Diagnosis not present

## 2021-06-19 DIAGNOSIS — Z131 Encounter for screening for diabetes mellitus: Secondary | ICD-10-CM | POA: Diagnosis not present

## 2021-06-19 DIAGNOSIS — Z136 Encounter for screening for cardiovascular disorders: Secondary | ICD-10-CM | POA: Diagnosis not present

## 2021-06-29 ENCOUNTER — Ambulatory Visit: Payer: BC Managed Care – PPO | Admitting: Family

## 2021-08-25 DIAGNOSIS — J029 Acute pharyngitis, unspecified: Secondary | ICD-10-CM | POA: Diagnosis not present

## 2021-08-25 DIAGNOSIS — Z20822 Contact with and (suspected) exposure to covid-19: Secondary | ICD-10-CM | POA: Diagnosis not present

## 2021-08-28 DIAGNOSIS — J40 Bronchitis, not specified as acute or chronic: Secondary | ICD-10-CM | POA: Diagnosis not present

## 2021-09-10 ENCOUNTER — Telehealth: Payer: Self-pay | Admitting: Family

## 2021-09-10 NOTE — Telephone Encounter (Signed)
Medication:  ALPRAZolam (XANAX) 0.5 MG tablet [[id number]] sertraline (ZOLOFT) 100 MG tablet [[id number]]   Has the patient contacted their pharmacy? No. (If no, request that the patient contact the pharmacy for the refill.) (If yes, when and what did the pharmacy advise?)     Preferred Pharmacy (with phone number or street name):  [id number] [place], [place] - [address] AT [facility] [address] [facility] [address] [facility] [address]  [address] [place] [address]  Phone:  [phone]  Fax:  [phone]    Agent: Please be advised that RX refills may take up to 3 business days. We ask that you follow-up with your pharmacy.  Pt is requesting 40 tablets for Alprazolam.

## 2021-09-10 NOTE — Telephone Encounter (Signed)
Pt is also requesting Zofran.   Pt states she's spoke with NP O'sullivan regarding anxiety.   Advised pt appt maybe needed since meds have no been prescribed by provider in the past.   Please advise.

## 2021-09-11 DIAGNOSIS — J189 Pneumonia, unspecified organism: Secondary | ICD-10-CM | POA: Diagnosis not present

## 2021-09-11 DIAGNOSIS — R9389 Abnormal findings on diagnostic imaging of other specified body structures: Secondary | ICD-10-CM | POA: Diagnosis not present

## 2021-09-11 DIAGNOSIS — R051 Acute cough: Secondary | ICD-10-CM | POA: Diagnosis not present

## 2021-09-18 ENCOUNTER — Encounter: Payer: Self-pay | Admitting: Family

## 2021-09-18 ENCOUNTER — Telehealth (INDEPENDENT_AMBULATORY_CARE_PROVIDER_SITE_OTHER): Payer: BC Managed Care – PPO | Admitting: Family

## 2021-09-18 DIAGNOSIS — F32A Depression, unspecified: Secondary | ICD-10-CM

## 2021-09-18 DIAGNOSIS — T753XXA Motion sickness, initial encounter: Secondary | ICD-10-CM | POA: Diagnosis not present

## 2021-09-18 DIAGNOSIS — E669 Obesity, unspecified: Secondary | ICD-10-CM | POA: Diagnosis not present

## 2021-09-18 DIAGNOSIS — F419 Anxiety disorder, unspecified: Secondary | ICD-10-CM

## 2021-09-18 MED ORDER — ALPRAZOLAM 0.5 MG PO TABS: 0.5000 mg | ORAL_TABLET | Freq: Two times a day (BID) | ORAL | 0 refills | Status: DC | PRN

## 2021-09-18 MED ORDER — ONDANSETRON HCL 4 MG PO TABS: 4.0000 mg | ORAL_TABLET | Freq: Three times a day (TID) | ORAL | 0 refills | Status: DC | PRN

## 2021-09-18 MED ORDER — SCOPOLAMINE 1 MG/3DAYS TD PT72: 1.0000 | MEDICATED_PATCH | TRANSDERMAL | 0 refills | Status: DC

## 2021-09-18 NOTE — Assessment & Plan Note (Signed)
Rx provided for scopolamine patch for her to use prior to next travel trip.  ?

## 2021-09-18 NOTE — Assessment & Plan Note (Signed)
Notes weight gain which she does not necessarily attribute to medication side effect. She really wants to be able to stay on the zoloft since it is helping her anxiety so much. Discussed diet/exercise/weight monitoring.  ?

## 2021-09-18 NOTE — Progress Notes (Signed)
? ? ?MyChart Video Visit ? ? ? ?Virtual Visit via Video Note  ? ?This visit type was conducted due to national recommendations for restrictions regarding the COVID-19 Pandemic (e.g. social distancing) in an effort to limit this patient's exposure and mitigate transmission in our community. This patient is at least at moderate risk for complications without adequate follow up. This format is felt to be most appropriate for this patient at this time. Physical exam was limited by quality of the video and audio technology used for the visit. CMA was able to get the patient set up on a video visit. ? ?Patient location: Home Patient and provider in visit ?Provider location: Office ? ?I discussed the limitations of evaluation and management by telemedicine and the availability of in person appointments. The patient expressed understanding and agreed to proceed. ? ?Visit Date: 09/18/2021 ? ?Today's healthcare provider: Lemont Fillers, NP  ? ? ? ?Subjective:  ? ? Patient ID: Nancy Wong, female    DOB: [birth year], 31 y.o.   MRN: [id number] ? ?No chief complaint on file. ? ? ?HPI ?Patient is in today for a virtual office visit. ? ?Depression / Anxiety / Nausea - Patient describes taking 0.5 MG Xanax during her trip to [place]. She stated that she started vomiting once she took flight. She was given 2 doses of Zofran but symptoms did not improve. She was also given Phenergan but symptoms persisted. Unfortunately, they had to re-route her plane due to her symptoms. Notes that she is doing much better on the zoloft. She is able to drive and leave the house without difficulty since she began.  ? ?Weight Gain/Exercise - She mentioned that she has weight gain. She stated that she is now at 185 lbs. She states that she is not mentally - equipped to deal with the weight gain. She is consistently exercising but wants to increase frequency of exercise. She also wants to stop eating desserts.  ? ?Wt Readings from Last 3  Encounters:  ?04/06/21 173 lb (78.5 kg)  ?03/13/21 175 lb (79.4 kg)  ?02/16/21 175 lb (79.4 kg)  ? ?Refill - Patient requests a refill 0.5 MG of alprazolam. She also requests a refill of 100 MG of sertraline.  ? ?Pneumonia - Patient reported not feeling well 4 weeks ago. She was diagnosed with mild pneumonia.  ? ? ? ? ?Past Medical History:  ?Diagnosis Date  ? Anxiety   ? COVID-19 virus infection 12/24/2020  ? History of cesarean section 01/03/2020  ? Hypertension   ? Hypothyroidism   ? Preeclampsia 02/20/2018  ? S/P cesarean section 02/20/2018  ? ? ?Past Surgical History:  ?Procedure Laterality Date  ? CESAREAN SECTION N/A 02/20/2018  ? Procedure: CESAREAN SECTION;  Surgeon: Marcelle Overlie, MD;  Location: [facility] BIRTHING SUIT[facility];  Service: Obstetrics;  Laterality: N/A;  ? CESAREAN SECTION N/A 01/03/2020  ? Procedure: REPEAT CESAREAN SECTION;  Surgeon: Harold Hedge, MD;  Location: [facility] LD ORS;  Service: Obstetrics;  Laterality: N/A;  Tracey RNFA  ? WISDOM TOOTH EXTRACTION    ? ? ?Family History  ?Problem Relation Age of Onset  ? Cancer Mother   ?     breast  ? Kidney disease Father   ?     had transplant- 2005  ? Hypertension Father   ? Healthy Brother   ? Heart disease Maternal Grandfather   ? Diabetes Maternal Grandfather   ? Diabetes Paternal Grandfather   ? Heart attack Paternal Grandfather   ? Heart disease  Paternal Grandfather   ? ? ?Social History  ? ?Socioeconomic History  ? Marital status: Married  ?  Spouse name: Not on file  ? Number of children: Not on file  ? Years of education: Not on file  ? Highest education level: Not on file  ?Occupational History  ? Not on file  ?Tobacco Use  ? Smoking status: Never  ? Smokeless tobacco: Never  ?Substance and Sexual Activity  ? Alcohol use: No  ? Drug use: No  ? Sexual activity: Yes  ?Other Topics Concern  ? Not on file  ?Social History Narrative  ? Not on file  ? ?Social Determinants of Health  ? ?Financial Resource Strain: Not on file  ?Food Insecurity: Not on file   ?Transportation Needs: Not on file  ?Physical Activity: Not on file  ?Stress: Not on file  ?Social Connections: Not on file  ?Intimate Partner Violence: Not on file  ? ? ?Outpatient Medications Prior to Visit  ?Medication Sig Dispense Refill  ? betamethasone valerate ointment (VALISONE) 0.1 % Apply 1 application topically 2 (two) times daily as needed. 30 g 0  ? sertraline (ZOLOFT) 100 MG tablet Take 1 tablet (100 mg total) by mouth daily. 30 tablet 2  ? ALPRAZolam (XANAX) 0.5 MG tablet Take 1 tablet (0.5 mg total) by mouth 2 (two) times daily as needed for anxiety. 20 tablet 0  ? Prenatal Vit-Fe Fumarate-FA (PRENATAL MULTIVITAMIN) TABS tablet Take 1 tablet by mouth daily at 12 noon.    ? ?No facility-administered medications prior to visit.  ? ? ?Allergies  ?Allergen Reactions  ? Septra [Sulfamethoxazole-Trimethoprim] Hives  ? ? ?Review of Systems  ?Constitutional:   ?     (+) Weight Gain  ? ?   ?Objective:  ?  ?Physical Exam ? ?There were no vitals taken for this visit. ?Wt Readings from Last 3 Encounters:  ?04/06/21 173 lb (78.5 kg)  ?03/13/21 175 lb (79.4 kg)  ?02/16/21 175 lb (79.4 kg)  ? ?Gen: Awake, alert, no acute distress ?Resp: Breathing is even and non-labored ?Psych: calm/pleasant demeanor ?Neuro: Alert and Oriented x 3, + facial symmetry, speech is clear. ? ?Assessment & Plan:  ? ?Problem List Items Addressed This Visit   ? ?  ? Unprioritized  ? Obesity (BMI 30-39.9) - Primary  ?  Notes weight gain which she does not necessarily attribute to medication side effect. She really wants to be able to stay on the zoloft since it is helping her anxiety so much. Discussed diet/exercise/weight monitoring.  ?  ?  ? Motion sickness  ?  Rx provided for scopolamine patch for her to use prior to next travel trip.  ?  ?  ? Anxiety and depression  ?  Wt Readings from Last 3 Encounters:  ?04/06/21 173 lb (78.5 kg)  ?03/13/21 175 lb (79.4 kg)  ?02/16/21 175 lb (79.4 kg)  ?Reports weight 185 lbs. Reports increased  appetite.  ?  ?  ? Relevant Medications  ? ALPRAZolam (XANAX) 0.5 MG tablet  ? ? ? ? ?Meds ordered this encounter  ?Medications  ? ALPRAZolam (XANAX) 0.5 MG tablet  ?  Sig: Take 1 tablet (0.5 mg total) by mouth 2 (two) times daily as needed for anxiety.  ?  Dispense:  20 tablet  ?  Refill:  0  ?  Order Specific Question:   Supervising Provider  ?  Answer:   Danise Edge A [[id number]]  ? ondansetron (ZOFRAN) 4 MG tablet  ?  Sig:  Take 1 tablet (4 mg total) by mouth every 8 (eight) hours as needed for nausea or vomiting.  ?  Dispense:  20 tablet  ?  Refill:  0  ?  Order Specific Question:   Supervising Provider  ?  Answer:   Danise Edge A [[id number]]  ? scopolamine (TRANSDERM-SCOP) 1 MG/3DAYS  ?  Sig: Place 1 patch (1.5 mg total) onto the skin every 3 (three) days.  ?  Dispense:  4 patch  ?  Refill:  0  ?  Order Specific Question:   Supervising Provider  ?  Answer:   Danise Edge A [[id number]]  ? ? ?I discussed the assessment and treatment plan with the patient. The patient was provided an opportunity to ask questions and all were answered. The patient agreed with the plan and demonstrated an understanding of the instructions. ?  ?The patient was advised to call back or seek an in-person evaluation if the symptoms worsen or if the condition fails to improve as anticipated. ? ?I provided 20 minutes of face-to-face time during this encounter. ? ? ?I,Amber Collins,acting as a Neurosurgeon for [employer], NP.,have documented all relevant documentation on the behalf of Lemont Fillers, NP,as directed by  Lemont Fillers, NP while in the presence of Lemont Fillers, NP. ? ? ? ?Lemont Fillers, NP ?Holiday representative at [employer] ?[phone] (phone) ?[phone] (fax) ? ?[facility]  ?

## 2021-09-18 NOTE — Assessment & Plan Note (Signed)
Wt Readings from Last 3 Encounters:  ?04/06/21 173 lb (78.5 kg)  ?03/13/21 175 lb (79.4 kg)  ?02/16/21 175 lb (79.4 kg)  ? ?Reports weight 185 lbs. Reports increased appetite.  ?

## 2021-09-23 ENCOUNTER — Encounter (HOSPITAL_COMMUNITY): Payer: Self-pay

## 2021-10-15 DIAGNOSIS — F419 Anxiety disorder, unspecified: Secondary | ICD-10-CM | POA: Diagnosis not present

## 2021-10-15 DIAGNOSIS — E669 Obesity, unspecified: Secondary | ICD-10-CM | POA: Diagnosis not present

## 2021-10-15 DIAGNOSIS — R5383 Other fatigue: Secondary | ICD-10-CM | POA: Diagnosis not present

## 2021-11-03 ENCOUNTER — Other Ambulatory Visit: Payer: Self-pay | Admitting: Family

## 2021-11-03 NOTE — Telephone Encounter (Signed)
Requesting: alprazolam ?Contract: 03/13/21 ?UDS: 03/13/21 ?Last Visit: 09/18/21 ?Next Visit: none ?Last Refill: 09/18/21 ? ?Please Advise ? ?

## 2021-11-04 MED ORDER — ALPRAZOLAM 0.5 MG PO TABS: 0.5000 mg | ORAL_TABLET | Freq: Two times a day (BID) | ORAL | 0 refills | Status: DC | PRN

## 2021-11-10 ENCOUNTER — Other Ambulatory Visit: Payer: Self-pay

## 2021-11-10 ENCOUNTER — Encounter: Payer: Self-pay | Admitting: Family

## 2021-11-10 MED ORDER — SERTRALINE HCL 100 MG PO TABS: 100.0000 mg | ORAL_TABLET | Freq: Every day | ORAL | 2 refills | Status: DC

## 2021-11-12 ENCOUNTER — Encounter: Payer: Self-pay | Admitting: Family

## 2021-12-15 ENCOUNTER — Other Ambulatory Visit: Payer: Self-pay | Admitting: Family

## 2021-12-15 MED ORDER — ALPRAZOLAM 0.5 MG PO TABS: 0.5000 mg | ORAL_TABLET | Freq: Two times a day (BID) | ORAL | 0 refills | Status: DC | PRN

## 2021-12-15 NOTE — Telephone Encounter (Signed)
Requesting:xanax 0.5 mg Contract:03/13/21 UDS:03/13/21 Last Visit:09/18/21 Next Visit:unknown Last Refill:11/04/21  Please Advise

## 2022-02-10 ENCOUNTER — Other Ambulatory Visit: Payer: Self-pay | Admitting: Family

## 2022-02-11 MED ORDER — SCOPOLAMINE 1 MG/3DAYS TD PT72
1.0000 | MEDICATED_PATCH | TRANSDERMAL | 0 refills | Status: DC
Start: 2022-02-11 — End: 2022-07-23

## 2022-02-11 MED ORDER — ALPRAZOLAM 0.5 MG PO TABS
0.5000 mg | ORAL_TABLET | Freq: Two times a day (BID) | ORAL | 0 refills | Status: DC | PRN
Start: 2022-02-11 — End: 2022-03-15

## 2022-02-11 NOTE — Telephone Encounter (Signed)
Requesting: alprazolam 0.5mg  Contract:03/13/21 UDS: 03/13/21 Last Visit: 09/18/21 Next Visit: None Last Refill: 12/15/21 #20 and 0RF  Please Advise

## 2022-03-15 ENCOUNTER — Telehealth: Payer: Self-pay | Admitting: Family

## 2022-03-15 ENCOUNTER — Other Ambulatory Visit: Payer: Self-pay | Admitting: Family

## 2022-03-16 MED ORDER — SERTRALINE HCL 100 MG PO TABS: 100.0000 mg | ORAL_TABLET | Freq: Every day | ORAL | 0 refills | Status: DC

## 2022-03-16 MED ORDER — ALPRAZOLAM 0.5 MG PO TABS
0.5000 mg | ORAL_TABLET | Freq: Two times a day (BID) | ORAL | 0 refills | Status: DC | PRN
Start: 2022-03-16 — End: 2022-04-07

## 2022-03-16 NOTE — Telephone Encounter (Signed)
Pt scheduled 9/20

## 2022-03-16 NOTE — Telephone Encounter (Signed)
Requesting: alprazolam 0.5mg   Contract: 03/13/21 UDS: 03/13/21 Last Visit: 09/18/21 Next Visit: None Last Refill: 02/11/22 #20 and 0RF  Please Advise

## 2022-03-16 NOTE — Telephone Encounter (Signed)
Please contact pt to schedule a follow up appointment.  °

## 2022-04-07 ENCOUNTER — Telehealth: Payer: Self-pay

## 2022-04-07 ENCOUNTER — Other Ambulatory Visit (HOSPITAL_BASED_OUTPATIENT_CLINIC_OR_DEPARTMENT_OTHER): Payer: Self-pay

## 2022-04-07 ENCOUNTER — Ambulatory Visit (INDEPENDENT_AMBULATORY_CARE_PROVIDER_SITE_OTHER): Payer: BC Managed Care – PPO | Admitting: Family

## 2022-04-07 VITALS — BP 139/81 | HR 91 | Temp 98.7°F | Resp 16 | Ht 62.0 in | Wt 189.0 lb

## 2022-04-07 DIAGNOSIS — R030 Elevated blood-pressure reading, without diagnosis of hypertension: Secondary | ICD-10-CM | POA: Diagnosis not present

## 2022-04-07 DIAGNOSIS — E669 Obesity, unspecified: Secondary | ICD-10-CM

## 2022-04-07 DIAGNOSIS — F419 Anxiety disorder, unspecified: Secondary | ICD-10-CM

## 2022-04-07 DIAGNOSIS — R7989 Other specified abnormal findings of blood chemistry: Secondary | ICD-10-CM | POA: Diagnosis not present

## 2022-04-07 DIAGNOSIS — F32A Depression, unspecified: Secondary | ICD-10-CM

## 2022-04-07 MED ORDER — ONDANSETRON HCL 4 MG PO TABS
4.0000 mg | ORAL_TABLET | Freq: Three times a day (TID) | ORAL | 0 refills | Status: DC | PRN
  Filled 2022-04-07: qty 9, 3d supply, fill #0

## 2022-04-07 MED ORDER — ALPRAZOLAM 0.5 MG PO TABS
0.5000 mg | ORAL_TABLET | Freq: Two times a day (BID) | ORAL | 0 refills | Status: DC | PRN
  Filled 2022-04-07: qty 20, 10d supply, fill #0

## 2022-04-07 MED ORDER — WEGOVY 0.25 MG/0.5ML ~~LOC~~ SOAJ
0.2500 mg | SUBCUTANEOUS | 0 refills | Status: DC
  Filled 2022-04-07: qty 2, 28d supply, fill #0

## 2022-04-07 NOTE — Progress Notes (Signed)
Subjective:     Patient ID: Nancy Wong, female    DOB: June 20, 1991, 31 y.o.   MRN: [id number]  Chief Complaint  Patient presents with   Anxiety    Here for follow up   Obesity    Will like to discuss options    Anxiety     Patient is in today for follow up.  Anxiety- maintained on zoloft and prn alprazolam. Reports significant improvement with the zoloft.  Does rely on her alprazolam when she travels.   Obesity- She is working on walking, carb portions, fruits/veggies.  She was told by a "functional doctor" that she has PCOS.  She was placed on progesterone supplements.  She took twice but stopped due to irritability.   April 2023 had labs drawn which she shows me via portal on her phone:  Normal TFTs Iron normal with borderline low iron sat Vit d normal Crp high Glucose 95 B12normal Insulin 15.2 normal Estradiol 1.7 normal Progesterone 42 low Testosterone 63 high Cortisol 9.1 normal   Wt Readings from Last 3 Encounters:  04/07/22 189 lb (85.7 kg)  04/06/21 173 lb (78.5 kg)  03/13/21 175 lb (79.4 kg)    Health Maintenance Due  Topic Date Due   COVID-19 Vaccine (1) Never done   Hepatitis C Screening  Never done   PAP SMEAR-Modifier  11/23/2019   INFLUENZA VACCINE  02/16/2022    Past Medical History:  Diagnosis Date   Anxiety    COVID-19 virus infection 12/24/2020   History of cesarean section 01/03/2020   Hypertension    Hypothyroidism    Preeclampsia 02/20/2018   S/P cesarean section 02/20/2018    Past Surgical History:  Procedure Laterality Date   CESAREAN SECTION N/A 02/20/2018   Procedure: CESAREAN SECTION;  Surgeon: Marcelle Overlie, MD;  Location: [facility] BIRTHING SUITES;  Service: Obstetrics;  Laterality: N/A;   CESAREAN SECTION N/A 01/03/2020   Procedure: REPEAT CESAREAN SECTION;  Surgeon: Harold Hedge, MD;  Location: [facility] LD ORS;  Service: Obstetrics;  Laterality: N/A;  Tracey RNFA   WISDOM TOOTH EXTRACTION      Family History  Problem  Relation Age of Onset   Cancer Mother        breast   Kidney disease Father        had transplant- 2005   Hypertension Father    Healthy Brother    Heart disease Maternal Grandfather    Diabetes Maternal Grandfather    Diabetes Paternal Grandfather    Heart attack Paternal Grandfather    Heart disease Paternal Grandfather     Social History   Socioeconomic History   Marital status: Married    Spouse name: Not on file   Number of children: Not on file   Years of education: Not on file   Highest education level: Not on file  Occupational History   Not on file  Tobacco Use   Smoking status: Never   Smokeless tobacco: Never  Substance and Sexual Activity   Alcohol use: No   Drug use: No   Sexual activity: Yes  Other Topics Concern   Not on file  Social History Narrative   Not on file   Social Determinants of Health   Financial Resource Strain: Not on file  Food Insecurity: Not on file  Transportation Needs: Not on file  Physical Activity: Not on file  Stress: Not on file  Social Connections: Not on file  Intimate Partner Violence: Not on file    Outpatient  Medications Prior to Visit  Medication Sig Dispense Refill   betamethasone valerate ointment (VALISONE) 0.1 % Apply 1 application topically 2 (two) times daily as needed. 30 g 0   scopolamine (TRANSDERM-SCOP) 1 MG/3DAYS Place 1 patch (1.5 mg total) onto the skin every 3 (three) days. 4 patch 0   sertraline (ZOLOFT) 100 MG tablet Take 1 tablet (100 mg total) by mouth daily. 30 tablet 0   ALPRAZolam (XANAX) 0.5 MG tablet Take 1 tablet (0.5 mg total) by mouth 2 (two) times daily as needed for anxiety. 20 tablet 0   ondansetron (ZOFRAN) 4 MG tablet Take 1 tablet (4 mg total) by mouth every 8 (eight) hours as needed for nausea or vomiting. 20 tablet 0   No facility-administered medications prior to visit.    Allergies  Allergen Reactions   Septra [Sulfamethoxazole-Trimethoprim] Hives    ROS See HPI     Objective:    Physical Exam Constitutional:      General: She is not in acute distress.    Appearance: Normal appearance. She is well-developed.  HENT:     Head: Normocephalic and atraumatic.     Right Ear: External ear normal.     Left Ear: External ear normal.  Eyes:     General: No scleral icterus. Neck:     Thyroid: No thyromegaly.  Cardiovascular:     Rate and Rhythm: Normal rate and regular rhythm.     Heart sounds: Normal heart sounds. No murmur heard. Pulmonary:     Effort: Pulmonary effort is normal. No respiratory distress.     Breath sounds: Normal breath sounds. No wheezing.  Musculoskeletal:     Cervical back: Neck supple.  Skin:    General: Skin is warm and dry.  Neurological:     Mental Status: She is alert and oriented to person, place, and time.  Psychiatric:        Mood and Affect: Mood normal.        Behavior: Behavior normal.        Thought Content: Thought content normal.        Judgment: Judgment normal.     BP 139/81 (BP Location: Right Arm, Patient Position: Sitting, Cuff Size: Small)   Pulse 91   Temp 98.7 F (37.1 C) (Oral)   Resp 16   Ht 5\' 2"  (1.575 m)   Wt 189 lb (85.7 kg)   SpO2 99%   BMI 34.57 kg/m  Wt Readings from Last 3 Encounters:  04/07/22 189 lb (85.7 kg)  04/06/21 173 lb (78.5 kg)  03/13/21 175 lb (79.4 kg)       Assessment & Plan:   Problem List Items Addressed This Visit       Unprioritized   Obesity (BMI 30-39.9)    Uncontrolled.  I don't think her insurance will cover wegovy, but will try.  Discussed risks/benefits common side effects.       Relevant Medications   Semaglutide-Weight Management (WEGOVY) 0.25 MG/0.5ML SOAJ   Elevated testosterone level - Primary    She reports regular periods, no know issues with ovarian cysts, fertility or unwanted hair growth, hyperglycemia. I advised her to discuss with her GYN.  I am not completely convinced of the PCOS diagnosis.      Elevated blood pressure reading     BP Readings from Last 3 Encounters:  04/07/22 139/81  04/06/21 133/81  03/13/21 134/71  Borderline BP, continue to work on lifestyle measures.       Anxiety and  depression    Stable on sertraline and alprazolam.      Relevant Medications   ALPRAZolam (XANAX) 0.5 MG tablet   32 minutes spent on pt's visit today.  Time was spent counseling pt on weight loss, weight loss therapy and depression/anxiety treatment.   I am having Nancy Wong start on Wegovy. I am also having her maintain her betamethasone valerate ointment, scopolamine, sertraline, ondansetron, and ALPRAZolam.  Meds ordered this encounter  Medications   Semaglutide-Weight Management (WEGOVY) 0.25 MG/0.5ML SOAJ    Sig: Inject 0.25 mg into the skin once a week.    Dispense:  2 mL    Refill:  0    Order Specific Question:   Supervising Provider    Answer:   Penni Homans A [[id number]]   ondansetron (ZOFRAN) 4 MG tablet    Sig: Take 1 tablet (4 mg total) by mouth every 8 (eight) hours as needed for nausea or vomiting.    Dispense:  20 tablet    Refill:  0    Order Specific Question:   Supervising Provider    Answer:   Penni Homans A [[id number]]   ALPRAZolam (XANAX) 0.5 MG tablet    Sig: Take 1 tablet (0.5 mg total) by mouth 2 (two) times daily as needed for anxiety.    Dispense:  20 tablet    Refill:  0    Order Specific Question:   Supervising Provider    Answer:   Penni Homans A [[id number]]

## 2022-04-07 NOTE — Assessment & Plan Note (Signed)
BP Readings from Last 3 Encounters:  04/07/22 139/81  04/06/21 133/81  03/13/21 134/71   Borderline BP, continue to work on lifestyle measures.

## 2022-04-07 NOTE — Assessment & Plan Note (Addendum)
She reports regular periods, no know issues with ovarian cysts, fertility or unwanted hair growth, hyperglycemia. I advised her to discuss with her GYN.  I am not completely convinced of the PCOS diagnosis.

## 2022-04-07 NOTE — Telephone Encounter (Signed)
PA initiated via Covermymeds; KEY: [id number]. PA approved.   [id number];SELTRV:[id number];Review Type:Prior Auth;Coverage Start Date:03/08/2022;Coverage End Date:11/03/2022;

## 2022-04-07 NOTE — Assessment & Plan Note (Signed)
Uncontrolled.  I don't think her insurance will cover wegovy, but will try.  Discussed risks/benefits common side effects.

## 2022-04-07 NOTE — Assessment & Plan Note (Signed)
Stable on sertraline and alprazolam.

## 2022-04-08 ENCOUNTER — Other Ambulatory Visit (HOSPITAL_BASED_OUTPATIENT_CLINIC_OR_DEPARTMENT_OTHER): Payer: Self-pay

## 2022-04-16 ENCOUNTER — Other Ambulatory Visit (HOSPITAL_BASED_OUTPATIENT_CLINIC_OR_DEPARTMENT_OTHER): Payer: Self-pay

## 2022-04-22 ENCOUNTER — Other Ambulatory Visit: Payer: Self-pay | Admitting: Family

## 2022-05-04 ENCOUNTER — Encounter: Payer: Self-pay | Admitting: Family

## 2022-05-04 NOTE — Telephone Encounter (Signed)
Requesting: alprazolam 0.5mg   Contract: 03/13/21 UDS: 03/13/21 Last Visit: 04/07/22 Next Visit: 10/06/22 Last Refill: 04/07/22 #20 and 0RF   Please Advise

## 2022-05-05 MED ORDER — ONDANSETRON HCL 4 MG PO TABS
4.0000 mg | ORAL_TABLET | Freq: Three times a day (TID) | ORAL | 0 refills | Status: DC | PRN
Start: 2022-05-05 — End: 2022-05-11

## 2022-05-05 MED ORDER — ALPRAZOLAM 0.5 MG PO TABS
0.5000 mg | ORAL_TABLET | Freq: Two times a day (BID) | ORAL | 0 refills | Status: DC | PRN
Start: 2022-05-05 — End: 2022-06-04

## 2022-05-10 ENCOUNTER — Telehealth: Payer: Self-pay | Admitting: *Deleted

## 2022-05-10 NOTE — Telephone Encounter (Signed)
[facility] [address] faxed over a request to change Zofran to the ODT tabs.  Ok to change?

## 2022-05-11 MED ORDER — ONDANSETRON 4 MG PO TBDP: 4.0000 mg | ORAL_TABLET | Freq: Three times a day (TID) | ORAL | 0 refills | Status: DC | PRN

## 2022-05-11 NOTE — Telephone Encounter (Signed)
Rx sent in

## 2022-06-04 ENCOUNTER — Other Ambulatory Visit: Payer: Self-pay | Admitting: Family

## 2022-06-07 MED ORDER — ALPRAZOLAM 0.5 MG PO TABS: 0.5000 mg | ORAL_TABLET | Freq: Two times a day (BID) | ORAL | 0 refills | Status: DC | PRN

## 2022-06-07 NOTE — Telephone Encounter (Signed)
Requesting: Xanax Contract: 03/13/2022 UDS: 03/13/2022 Last Visit: 04/07/2022 Next Visit: 10/06/2022 Last Refill: 05/05/2022  Please Advise

## 2022-06-18 DIAGNOSIS — Z136 Encounter for screening for cardiovascular disorders: Secondary | ICD-10-CM | POA: Diagnosis not present

## 2022-06-18 DIAGNOSIS — Z131 Encounter for screening for diabetes mellitus: Secondary | ICD-10-CM | POA: Diagnosis not present

## 2022-06-18 DIAGNOSIS — Z6829 Body mass index (BMI) 29.0-29.9, adult: Secondary | ICD-10-CM | POA: Diagnosis not present

## 2022-06-18 DIAGNOSIS — Z13220 Encounter for screening for lipoid disorders: Secondary | ICD-10-CM | POA: Diagnosis not present

## 2022-06-18 DIAGNOSIS — Z713 Dietary counseling and surveillance: Secondary | ICD-10-CM | POA: Diagnosis not present

## 2022-06-25 ENCOUNTER — Encounter: Payer: Self-pay | Admitting: Family Medicine

## 2022-06-25 ENCOUNTER — Telehealth (INDEPENDENT_AMBULATORY_CARE_PROVIDER_SITE_OTHER): Payer: BC Managed Care – PPO | Admitting: Family Medicine

## 2022-06-25 DIAGNOSIS — R112 Nausea with vomiting, unspecified: Secondary | ICD-10-CM | POA: Diagnosis not present

## 2022-06-25 MED ORDER — PROMETHAZINE HCL 12.5 MG RE SUPP: 12.5000 mg | Freq: Four times a day (QID) | RECTAL | 0 refills | Status: AC | PRN

## 2022-06-25 NOTE — Progress Notes (Signed)
Virtual Video Visit via MyChart Note  I connected with  Nancy Wong on 06/25/22 at  9:00 AM EST by the video enabled telemedicine application for MyChart, and verified that I am speaking with the correct person using two identifiers.   I introduced myself as a Publishing rights manager with the practice. We discussed the limitations of evaluation and management by telemedicine and the availability of in person appointments. The patient expressed understanding and agreed to proceed.  Participating parties in this visit include: The patient and the nurse practitioner listed.  The patient is: At home I am: In the office - [facility]  Subjective:    CC: nausea and vomiting   HPI: Nancy Wong is a 31 y.o. year old female presenting today via MyChart today for nausea and vomiting.  Reports her son was sick on Tuesday (preschool stomach bug). Her symptoms started last night around 1:30 am. She has been taking zofran, but still not able to keep anything down. She is requesting phenergan suppository. No urinary changes, lightheadedness, lethargy, syncope.      Past medical history, Surgical history, Family history not pertinant except as noted below, Social history, Allergies, and medications have been entered into the medical record, reviewed, and corrections made.   Review of Systems:  All review of systems negative except what is listed in the HPI   Objective:    General:  Speaking clearly in complete sentences. Absent shortness of breath noted.   Alert and oriented x3.   Normal judgment.  Absent acute distress.   Impression and Recommendations:    1. Nausea and vomiting, unspecified vomiting type - promethazine (PHENERGAN) 12.5 MG suppository; Place 1 suppository (12.5 mg total) rectally every 6 (six) hours as needed for nausea or vomiting.  Dispense: 12 each; Refill: 0 Adding phenergan suppositories. Try to sip on clear liquids  as tolerated Educated on signs of dehydration to monitor for and when to seek emergency care   Follow-up if symptoms worsen or fail to improve.    I discussed the assessment and treatment plan with the patient. The patient was provided an opportunity to ask questions and all were answered. The patient agreed with the plan and demonstrated an understanding of the instructions.   The patient was advised to call back or seek an in-person evaluation if the symptoms worsen or if the condition fails to improve as anticipated.  I spent 20 minutes dedicated to the care of this patient on the date of this encounter to include pre-visit chart review of prior notes and results, face-to-face time with the patient, and post-visit ordering of testing as indicated.   Clayborne Dana, NP

## 2022-06-27 ENCOUNTER — Other Ambulatory Visit: Payer: Self-pay | Admitting: Family

## 2022-06-28 NOTE — Telephone Encounter (Signed)
Requesting: Alprazolam 0.5mg  Contract: 03/13/21 UDS: 03/13/21 Last Visit: 04/07/22 Next Visit: 10/06/22 Last Refill: 06/07/22  Please Advise

## 2022-06-29 MED ORDER — ALPRAZOLAM 0.5 MG PO TABS: 0.5000 mg | ORAL_TABLET | Freq: Two times a day (BID) | ORAL | 0 refills | Status: DC | PRN

## 2022-07-23 ENCOUNTER — Telehealth: Payer: Self-pay | Admitting: Family

## 2022-07-23 ENCOUNTER — Telehealth (INDEPENDENT_AMBULATORY_CARE_PROVIDER_SITE_OTHER): Payer: Self-pay | Admitting: Family

## 2022-07-23 ENCOUNTER — Encounter: Payer: Self-pay | Admitting: Family

## 2022-07-23 VITALS — Wt 166.0 lb

## 2022-07-23 DIAGNOSIS — T753XXA Motion sickness, initial encounter: Secondary | ICD-10-CM

## 2022-07-23 DIAGNOSIS — F419 Anxiety disorder, unspecified: Secondary | ICD-10-CM

## 2022-07-23 DIAGNOSIS — E669 Obesity, unspecified: Secondary | ICD-10-CM

## 2022-07-23 DIAGNOSIS — F32A Depression, unspecified: Secondary | ICD-10-CM

## 2022-07-23 MED ORDER — ONDANSETRON 4 MG PO TBDP: ORAL_TABLET | ORAL | 0 refills | Status: DC

## 2022-07-23 MED ORDER — TIRZEPATIDE 2.5 MG/0.5ML ~~LOC~~ SOAJ: 2.5000 mg | SUBCUTANEOUS | Status: AC

## 2022-07-23 MED ORDER — SERTRALINE HCL 100 MG PO TABS: 100.0000 mg | ORAL_TABLET | Freq: Every day | ORAL | 1 refills | Status: DC

## 2022-07-23 MED ORDER — SCOPOLAMINE 1 MG/3DAYS TD PT72: 1.0000 | MEDICATED_PATCH | TRANSDERMAL | 0 refills | Status: DC

## 2022-07-23 MED ORDER — ALPRAZOLAM 0.5 MG PO TABS: ORAL_TABLET | ORAL | 0 refills | Status: DC

## 2022-07-23 NOTE — Assessment & Plan Note (Signed)
Sent refill of scopolamine patch for her trip.

## 2022-07-23 NOTE — Assessment & Plan Note (Signed)
Wt Readings from Last 3 Encounters:  07/23/22 166 lb (75.3 kg)  04/07/22 189 lb (85.7 kg)  04/06/21 173 lb (78.5 kg)   Weight is coming down nicely on Mounjaro. Pt will continue to follow with weight loss clinic in [place].

## 2022-07-23 NOTE — Assessment & Plan Note (Signed)
Overall stable on sertraline with the exception of fear of flying.  Advised pt that she can dose the xanax 1-2 tabs every 6 hours as needed. Advised her to wear scopolamine patch on flight and pre-medicate with zofran prior to flight.

## 2022-07-23 NOTE — Progress Notes (Signed)
Virtual telephone visit    Virtual Visit via Telephone Note   This visit type was conducted due to national recommendations for restrictions regarding the COVID-19 Pandemic (e.g. social distancing) in an effort to limit this patient's exposure and mitigate transmission in our community. Due to her co-morbid illnesses, this patient is at least at moderate risk for complications without adequate follow up. This format is felt to be most appropriate for this patient at this time. The patient did not have access to video technology or had technical difficulties with video requiring transitioning to audio format only (telephone). Physical exam was limited to content and character of the telephone converstion. CMA was able to get the patient set up on a telephone visit.   Patient location: Home- Patient, medical scribe, and provider in visit Provider location: Office  I discussed the limitations of evaluation and management by telemedicine and the availability of in person appointments. The patient expressed understanding and agreed to proceed.   Visit Date: 07/23/2022  Today's healthcare provider: Lemont Fillers, NP     Subjective:    Patient ID: Nancy Wong, female    DOB: 10-10-1990, 32 y.o.   MRN: [id number]  Chief Complaint  Patient presents with   Anxiety    To discuss medication for flying and anxiety.     HPI Patient is in today for anxiety follow up.  Anxiety: Patient compliant with Xanax 0.5mg  up to twice daily as needed and Zoloft 100mg  daily. She states that she recently flew home from [place] with recurrence of her flight anxiety and motion sickness resulting in vomiting. She was wearing her scopolamine patch and took Xanax 0.5mg  and Zofran 4mg  before the flight but needed another dose of both during he flight. She is planning a trip to [place] with her Husband in March 2024 but worries significantly that she will get sick again resulting in the plane to  reroute as it did last year during a flight to [place]. She has considering cancelling her flights to April 2024 multiple times.  She would like to consider therapy and has heard about EMDR.   Weight management: She has started Tirzepatide recently and has tolerated this well. She is followed by a weight management clinic in [place]. Wt Readings from Last 5 Encounters:  07/23/22 166 lb (75.3 kg)  04/07/22 189 lb (85.7 kg)  04/06/21 173 lb (78.5 kg)  03/13/21 175 lb (79.4 kg)  02/16/21 175 lb (79.4 kg)     Past Medical History:  Diagnosis Date   Anxiety    COVID-19 virus infection 12/24/2020   History of cesarean section 01/03/2020   Hypertension    Hypothyroidism    Preeclampsia 02/20/2018   S/P cesarean section 02/20/2018    Past Surgical History:  Procedure Laterality Date   CESAREAN SECTION N/A 02/20/2018   Procedure: CESAREAN SECTION;  Surgeon: 04/22/2018, MD;  Location: [facility];  Service: Obstetrics;  Laterality: N/A;   CESAREAN SECTION N/A 01/03/2020   Procedure: REPEAT CESAREAN SECTION;  Surgeon: [facility], MD;  Location: [facility];  Service: Obstetrics;  Laterality: N/A;  Tracey RNFA   WISDOM TOOTH EXTRACTION      Family History  Problem Relation Age of Onset   Cancer Mother        breast   Kidney disease Father        had transplant- 2005   Hypertension Father    Healthy Brother    Heart disease Maternal Grandfather  Diabetes Maternal Grandfather    Diabetes Paternal Grandfather    Heart attack Paternal Grandfather    Heart disease Paternal Grandfather     Social History   Socioeconomic History   Marital status: Married    Spouse name: Not on file   Number of children: Not on file   Years of education: Not on file   Highest education level: Not on file  Occupational History   Not on file  Tobacco Use   Smoking status: Never   Smokeless tobacco: Never  Substance and Sexual Activity   Alcohol use: No   Drug use: No   Sexual activity: Yes   Other Topics Concern   Not on file  Social History Narrative   Not on file   Social Determinants of Health   Financial Resource Strain: Not on file  Food Insecurity: Not on file  Transportation Needs: Not on file  Physical Activity: Not on file  Stress: Not on file  Social Connections: Not on file  Intimate Partner Violence: Not on file    Outpatient Medications Prior to Visit  Medication Sig Dispense Refill   betamethasone valerate ointment (VALISONE) 0.1 % Apply 1 application topically 2 (two) times daily as needed. 30 g 0   promethazine (PHENERGAN) 12.5 MG suppository Place 1 suppository (12.5 mg total) rectally every 6 (six) hours as needed for nausea or vomiting. 12 each 0   ALPRAZolam (XANAX) 0.5 MG tablet Take 1 tablet (0.5 mg total) by mouth 2 (two) times daily as needed for anxiety. 20 tablet 0   ondansetron (ZOFRAN-ODT) 4 MG disintegrating tablet Take 1 tablet (4 mg total) by mouth every 8 (eight) hours as needed for nausea or vomiting. 20 tablet 0   scopolamine (TRANSDERM-SCOP) 1 MG/3DAYS Place 1 patch (1.5 mg total) onto the skin every 3 (three) days. 4 patch 0   Semaglutide-Weight Management (WEGOVY) 0.25 MG/0.5ML SOAJ Inject 0.25 mg into the skin once a week. 2 mL 0   sertraline (ZOLOFT) 100 MG tablet Take 1 tablet (100 mg total) by mouth daily. 90 tablet 1   No facility-administered medications prior to visit.    Allergies  Allergen Reactions   Septra [Sulfamethoxazole-Trimethoprim] Hives    Review of Systems  Constitutional:  Negative for fever and malaise/fatigue.  HENT:  Negative for congestion.   Eyes:  Negative for blurred vision.  Respiratory:  Negative for shortness of breath.   Cardiovascular:  Negative for chest pain, palpitations and leg swelling.  Gastrointestinal:  Negative for abdominal pain, blood in stool and nausea.  Genitourinary:  Negative for dysuria and frequency.  Musculoskeletal:  Negative for falls.  Skin:  Negative for rash.   Neurological:  Negative for dizziness, loss of consciousness and headaches.  Endo/Heme/Allergies:  Negative for environmental allergies.  Psychiatric/Behavioral:  Negative for depression. The patient is nervous/anxious.        Objective:    Physical Exam  Wt 166 lb (75.3 kg)   BMI 30.36 kg/m  Wt Readings from Last 3 Encounters:  07/23/22 166 lb (75.3 kg)  04/07/22 189 lb (85.7 kg)  04/06/21 173 lb (78.5 kg)    Gen: Awake, alert, no acute distress Resp: Breathing is even and non-labored Psych: calm/pleasant demeanor Neuro: Alert and Oriented x 3, + facial symmetry, speech is clear.     Assessment & Plan:  Motion sickness, initial encounter Assessment & Plan: Sent refill of scopolamine patch for her trip.    Anxiety and depression Assessment & Plan: Overall stable on  sertraline with the exception of fear of flying.  Advised pt that she can dose the xanax 1-2 tabs every 6 hours as needed. Advised her to wear scopolamine patch on flight and pre-medicate with zofran prior to flight.    Obesity (BMI 30-39.9) Assessment & Plan: Wt Readings from Last 3 Encounters:  07/23/22 166 lb (75.3 kg)  04/07/22 189 lb (85.7 kg)  04/06/21 173 lb (78.5 kg)   Weight is coming down nicely on Mounjaro. Pt will continue to follow with weight loss clinic in [place].    Other orders -     Scopolamine; Place 1 patch (1.5 mg total) onto the skin every 3 (three) days.  Dispense: 4 patch; Refill: 0 -     Ondansetron; 1-2 tabs by mouth every 8 hours as needed  Dispense: 20 tablet; Refill: 0 -     ALPRAZolam; 1-2 tabs by mouth every 6 hours as needed for anxiety  Dispense: 20 tablet; Refill: 0 -     Sertraline HCl; Take 1 tablet (100 mg total) by mouth daily.  Dispense: 90 tablet; Refill: 1 -     Tirzepatide; Inject 2.5 mg into the skin once a week.  Dispense: 2 mL    I discussed the assessment and treatment plan with the patient. The patient was provided an opportunity to ask questions and all  were answered. The patient agreed with the plan and demonstrated an understanding of the instructions.   The patient was advised to call back or seek an in-person evaluation if the symptoms worsen or if the condition fails to improve as anticipated.  I,Alexis Herring,acting as a Education administrator for [employer], NP.,have documented all relevant documentation on the behalf of Nance Pear, NP,as directed by  Nance Pear, NP while in the presence of Nance Pear, NP.    Nance Pear, NP [employer] [facility] [employer] [phone] (phone) [phone] (fax)  [place]

## 2022-07-23 NOTE — Telephone Encounter (Signed)
See my chart message

## 2022-08-03 DIAGNOSIS — F410 Panic disorder [episodic paroxysmal anxiety] without agoraphobia: Secondary | ICD-10-CM | POA: Diagnosis not present

## 2022-08-09 DIAGNOSIS — F410 Panic disorder [episodic paroxysmal anxiety] without agoraphobia: Secondary | ICD-10-CM | POA: Diagnosis not present

## 2022-08-18 DIAGNOSIS — F410 Panic disorder [episodic paroxysmal anxiety] without agoraphobia: Secondary | ICD-10-CM | POA: Diagnosis not present

## 2022-08-28 DIAGNOSIS — F410 Panic disorder [episodic paroxysmal anxiety] without agoraphobia: Secondary | ICD-10-CM | POA: Diagnosis not present

## 2022-08-29 ENCOUNTER — Other Ambulatory Visit: Payer: Self-pay | Admitting: Family

## 2022-08-30 MED ORDER — ALPRAZOLAM 0.5 MG PO TABS: ORAL_TABLET | ORAL | 0 refills | Status: AC

## 2022-08-30 NOTE — Telephone Encounter (Signed)
Requesting: alprazolam 0.46m  Contract: 03/13/21 UDS: 03/13/21 Last Visit: 07/23/22 Next Visit: 10/06/22 Last Refill: 07/23/22 #20 and 0RF   Please Advise

## 2022-09-02 DIAGNOSIS — Z124 Encounter for screening for malignant neoplasm of cervix: Secondary | ICD-10-CM | POA: Diagnosis not present

## 2022-09-02 DIAGNOSIS — Z01419 Encounter for gynecological examination (general) (routine) without abnormal findings: Secondary | ICD-10-CM | POA: Diagnosis not present

## 2022-09-02 DIAGNOSIS — Z6827 Body mass index (BMI) 27.0-27.9, adult: Secondary | ICD-10-CM | POA: Diagnosis not present

## 2022-09-02 DIAGNOSIS — Z1151 Encounter for screening for human papillomavirus (HPV): Secondary | ICD-10-CM | POA: Diagnosis not present

## 2022-09-03 ENCOUNTER — Other Ambulatory Visit: Payer: Self-pay | Admitting: Obstetrics and Gynecology

## 2022-09-03 DIAGNOSIS — N6320 Unspecified lump in the left breast, unspecified quadrant: Secondary | ICD-10-CM

## 2022-09-08 ENCOUNTER — Ambulatory Visit
Admission: RE | Admit: 2022-09-08 | Discharge: 2022-09-08 | Disposition: A | Discharge status: Home / Self Care | Payer: Self-pay | Source: Ambulatory Visit | Attending: Obstetrics and Gynecology | Admitting: Obstetrics and Gynecology

## 2022-09-08 ENCOUNTER — Ambulatory Visit
Admission: RE | Admit: 2022-09-08 | Discharge: 2022-09-08 | Disposition: A | Discharge status: Home / Self Care | Payer: BC Managed Care – PPO | Source: Ambulatory Visit | Attending: Obstetrics and Gynecology | Admitting: Obstetrics and Gynecology

## 2022-09-08 DIAGNOSIS — R928 Other abnormal and inconclusive findings on diagnostic imaging of breast: Secondary | ICD-10-CM | POA: Diagnosis not present

## 2022-09-08 DIAGNOSIS — N6320 Unspecified lump in the left breast, unspecified quadrant: Secondary | ICD-10-CM

## 2022-09-08 DIAGNOSIS — N6489 Other specified disorders of breast: Secondary | ICD-10-CM | POA: Diagnosis not present

## 2022-09-10 DIAGNOSIS — F410 Panic disorder [episodic paroxysmal anxiety] without agoraphobia: Secondary | ICD-10-CM | POA: Diagnosis not present

## 2022-09-16 ENCOUNTER — Other Ambulatory Visit: Payer: Self-pay

## 2022-10-06 ENCOUNTER — Ambulatory Visit: Payer: BC Managed Care – PPO | Admitting: Family

## 2022-10-27 ENCOUNTER — Telehealth (INDEPENDENT_AMBULATORY_CARE_PROVIDER_SITE_OTHER): Payer: BC Managed Care – PPO | Admitting: Family

## 2022-10-27 VITALS — Wt 151.0 lb

## 2022-10-27 DIAGNOSIS — F419 Anxiety disorder, unspecified: Secondary | ICD-10-CM

## 2022-10-27 DIAGNOSIS — E663 Overweight: Secondary | ICD-10-CM

## 2022-10-27 DIAGNOSIS — F32A Depression, unspecified: Secondary | ICD-10-CM

## 2022-10-27 DIAGNOSIS — M5417 Radiculopathy, lumbosacral region: Secondary | ICD-10-CM | POA: Insufficient documentation

## 2022-10-27 MED ORDER — ESCITALOPRAM OXALATE 10 MG PO TABS: 10.0000 mg | ORAL_TABLET | Freq: Every day | ORAL | Status: AC

## 2022-10-27 NOTE — Progress Notes (Signed)
MyChart Video Visit    Virtual Visit via Video Note     Patient location: Home Patient and provider in visit Provider location: Office  I discussed the limitations of evaluation and management by telemedicine and the availability of in person appointments. The patient expressed understanding and agreed to proceed.  Visit Date: 10/27/2022  Today's healthcare provider: Lemont Fillers, NP     Subjective:    Patient ID: Nancy Wong, female    DOB: 02/10/1991, 32 y.o.   MRN: [id number]  Chief Complaint  Patient presents with   Foot Problem    Patient reports of having vibration feeling on "outside of right foot". This has been going on since January on and off.     HPI Patient is in today for a video visit.    Right foot pain: She complains of intermittent pulsing on the outside middle region of the right foot for the past four months. She walks her dog daily and is unsure if the pulsing is worse when walking. She denies having any radiating pain down her buttock. She denies having any lower back pain.   Weight: She is 151 lbs during this visit. She is taking 5 mg Mounjaro injections weekly. She has visits with [facility] regularly.   Lexapro: She is taking 10 mg Lexapro daily PO. She is seeing Dr. Kathleene Hazel for psychiatry.   Past Medical History:  Diagnosis Date   Anxiety    COVID-19 virus infection 12/24/2020   History of cesarean section 01/03/2020   Hypertension    Hypothyroidism    Preeclampsia 02/20/2018   S/P cesarean section 02/20/2018    Past Surgical History:  Procedure Laterality Date   CESAREAN SECTION N/A 02/20/2018   Procedure: CESAREAN SECTION;  Surgeon: Marcelle Overlie, MD;  Location: [facility];  Service: Obstetrics;  Laterality: N/A;   CESAREAN SECTION N/A 01/03/2020   Procedure: REPEAT CESAREAN SECTION;  Surgeon: Harold Hedge, MD;  Location: [facility];  Service: Obstetrics;  Laterality: N/A;  Tracey RNFA   WISDOM  TOOTH EXTRACTION      Family History  Problem Relation Age of Onset   Cancer Mother        breast   Kidney disease Father        had transplant- 2005   Hypertension Father    Healthy Brother    Heart disease Maternal Grandfather    Diabetes Maternal Grandfather    Diabetes Paternal Grandfather    Heart attack Paternal Grandfather    Heart disease Paternal Grandfather     Social History   Socioeconomic History   Marital status: Married    Spouse name: Not on file   Number of children: Not on file   Years of education: Not on file   Highest education level: Not on file  Occupational History   Not on file  Tobacco Use   Smoking status: Never   Smokeless tobacco: Never  Substance and Sexual Activity   Alcohol use: No   Drug use: No   Sexual activity: Yes  Other Topics Concern   Not on file  Social History Narrative   Not on file   Social Determinants of Health   Financial Resource Strain: Not on file  Food Insecurity: Not on file  Transportation Needs: Not on file  Physical Activity: Not on file  Stress: Not on file  Social Connections: Not on file  Intimate Partner Violence: Not on file    Outpatient Medications Prior  to Visit  Medication Sig Dispense Refill   ALPRAZolam (XANAX) 0.5 MG tablet 1-2 tabs by mouth every 6 hours as needed for anxiety 20 tablet 0   betamethasone valerate ointment (VALISONE) 0.1 % Apply 1 application topically 2 (two) times daily as needed. 30 g 0   ondansetron (ZOFRAN-ODT) 4 MG disintegrating tablet 1-2 tabs by mouth every 8 hours as needed 20 tablet 0   promethazine (PHENERGAN) 12.5 MG suppository Place 1 suppository (12.5 mg total) rectally every 6 (six) hours as needed for nausea or vomiting. 12 each 0   scopolamine (TRANSDERM-SCOP) 1 MG/3DAYS Place 1 patch (1.5 mg total) onto the skin every 3 (three) days. 4 patch 0   tirzepatide (MOUNJARO) 2.5 MG/0.5ML Pen Inject 2.5 mg into the skin once a week. 2 mL    sertraline (ZOLOFT) 100  MG tablet Take 1 tablet (100 mg total) by mouth daily. 90 tablet 1   No facility-administered medications prior to visit.    Allergies  Allergen Reactions   Septra [Sulfamethoxazole-Trimethoprim] Hives    Review of Systems  Musculoskeletal:        Right foot pain in middle outer foot       Objective:    Physical Exam  Wt 151 lb (68.5 kg)   BMI 27.62 kg/m  Wt Readings from Last 3 Encounters:  10/27/22 151 lb (68.5 kg)  07/23/22 166 lb (75.3 kg)  04/07/22 189 lb (85.7 kg)    Gen: Awake, alert, no acute distress Resp: Breathing is even and non-labored Psych: calm/pleasant demeanor Neuro: Alert and Oriented x 3, + facial symmetry, speech is clear.     Assessment & Plan:  Anxiety and depression Assessment & Plan: Patient is currently working with psychiatry and was changed from sertraline to lexapro.  Her pychiatry provider is Dr. Kathleene Hazel in [place]. She reports that her mood is well controlled on lexapro 10mg .    Overweight Assessment & Plan: She is following with [facility] in [place].  She as been treated with compounded tirzepatide.  She plans to go back on it to help her lose another 15-20 pounds.  She is pleased with her weight loss.   Wt Readings from Last 3 Encounters:  10/27/22 151 lb (68.5 kg)  07/23/22 166 lb (75.3 kg)  04/07/22 189 lb (85.7 kg)      Lumbosacral radiculopathy at S1 Assessment & Plan: Pt's description of nerve discomfort in her foot most consistent with S1 nerve. Thankfully she does not have any lower back pain.  We discussed good ergonomics- don't sit cross leg.  Can try advil bid for a few days to see if this helps.  Reassurance provided.    Other orders -     Escitalopram Oxalate; Take 1 tablet (10 mg total) by mouth daily.     I discussed the assessment and treatment plan with the patient. The patient was provided an opportunity to ask questions and all were answered. The patient agreed with the plan and demonstrated an  understanding of the instructions.   The patient was advised to call back or seek an in-person evaluation if the symptoms worsen or if the condition fails to improve as anticipated.  Lemont Fillers, NP [facility] at [facility] [phone] (phone) [phone] (fax)  [facility]    [place] [employer] as a scribe for Lemont Fillers, NP.,have documented all relevant documentation on the behalf of Lemont Fillers, NP,as directed by  Katrinka Blazing  Peggyann Juba'Sullivan, NP while in the presence of Lemont FillersMelissa S O'Sullivan, NP.

## 2022-10-27 NOTE — Assessment & Plan Note (Signed)
Pt's description of nerve discomfort in her foot most consistent with S1 nerve. Thankfully she does not have any lower back pain.  We discussed good ergonomics- don't sit cross leg.  Can try advil bid for a few days to see if this helps.  Reassurance provided.

## 2022-10-27 NOTE — Assessment & Plan Note (Addendum)
Patient is currently working with psychiatry and was changed from sertraline to lexapro.  Her pychiatry provider is Dr. Kathleene Hazel in [place]. She reports that her mood is well controlled on lexapro 10mg .

## 2022-10-27 NOTE — Assessment & Plan Note (Addendum)
She is following with [facility] in [place].  She as been treated with compounded tirzepatide.  She plans to go back on it to help her lose another 15-20 pounds.  She is pleased with her weight loss.   Wt Readings from Last 3 Encounters:  10/27/22 151 lb (68.5 kg)  07/23/22 166 lb (75.3 kg)  04/07/22 189 lb (85.7 kg)

## 2022-11-02 ENCOUNTER — Telehealth: Payer: BC Managed Care – PPO | Admitting: Family

## 2023-03-24 ENCOUNTER — Other Ambulatory Visit: Payer: Self-pay | Admitting: Family

## 2023-03-25 NOTE — Telephone Encounter (Signed)
Requesting: alprazolam 0.5mg   Contract: 03/13/21 UDS: 03/13/21 Last Visit: 10/27/2022 Next Visit: none scheduled Last Refill: 08/30/22 #20 and 0RF    Please Advise

## 2023-05-26 DIAGNOSIS — E663 Overweight: Secondary | ICD-10-CM | POA: Diagnosis not present

## 2023-05-26 DIAGNOSIS — E282 Polycystic ovarian syndrome: Secondary | ICD-10-CM | POA: Diagnosis not present

## 2023-05-26 DIAGNOSIS — E559 Vitamin D deficiency, unspecified: Secondary | ICD-10-CM | POA: Diagnosis not present

## 2023-05-31 DIAGNOSIS — B349 Viral infection, unspecified: Secondary | ICD-10-CM | POA: Diagnosis not present

## 2023-08-04 ENCOUNTER — Other Ambulatory Visit: Payer: Self-pay | Admitting: Family

## 2023-08-05 MED ORDER — ONDANSETRON 4 MG PO TBDP: ORAL_TABLET | ORAL | 0 refills | Status: AC

## 2023-08-05 MED ORDER — SCOPOLAMINE 1 MG/3DAYS TD PT72: 1.0000 | MEDICATED_PATCH | TRANSDERMAL | 0 refills | Status: AC

## 2023-08-05 NOTE — Telephone Encounter (Signed)
Patient Comment: Hey! We have another trip coming up to [place] and these medications helped me get there last time :)

## 2023-08-08 DIAGNOSIS — F410 Panic disorder [episodic paroxysmal anxiety] without agoraphobia: Secondary | ICD-10-CM | POA: Diagnosis not present

## 2023-08-16 DIAGNOSIS — Z20822 Contact with and (suspected) exposure to covid-19: Secondary | ICD-10-CM | POA: Diagnosis not present

## 2023-08-16 DIAGNOSIS — J101 Influenza due to other identified influenza virus with other respiratory manifestations: Secondary | ICD-10-CM | POA: Diagnosis not present

## 2023-09-05 DIAGNOSIS — F410 Panic disorder [episodic paroxysmal anxiety] without agoraphobia: Secondary | ICD-10-CM | POA: Diagnosis not present

## 2023-09-19 DIAGNOSIS — F410 Panic disorder [episodic paroxysmal anxiety] without agoraphobia: Secondary | ICD-10-CM | POA: Diagnosis not present

## 2023-10-07 DIAGNOSIS — Z6824 Body mass index (BMI) 24.0-24.9, adult: Secondary | ICD-10-CM | POA: Diagnosis not present

## 2023-10-07 DIAGNOSIS — Z01419 Encounter for gynecological examination (general) (routine) without abnormal findings: Secondary | ICD-10-CM | POA: Diagnosis not present

## 2023-10-07 DIAGNOSIS — E039 Hypothyroidism, unspecified: Secondary | ICD-10-CM | POA: Diagnosis not present

## 2023-11-16 DIAGNOSIS — D225 Melanocytic nevi of trunk: Secondary | ICD-10-CM | POA: Diagnosis not present

## 2023-11-16 DIAGNOSIS — L858 Other specified epidermal thickening: Secondary | ICD-10-CM | POA: Diagnosis not present

## 2023-11-16 DIAGNOSIS — L821 Other seborrheic keratosis: Secondary | ICD-10-CM | POA: Diagnosis not present

## 2023-11-16 DIAGNOSIS — L814 Other melanin hyperpigmentation: Secondary | ICD-10-CM | POA: Diagnosis not present

## 2023-11-17 DIAGNOSIS — E88819 Insulin resistance, unspecified: Secondary | ICD-10-CM | POA: Diagnosis not present

## 2023-11-17 DIAGNOSIS — Z1329 Encounter for screening for other suspected endocrine disorder: Secondary | ICD-10-CM | POA: Diagnosis not present

## 2023-11-17 DIAGNOSIS — E559 Vitamin D deficiency, unspecified: Secondary | ICD-10-CM | POA: Diagnosis not present

## 2023-11-17 DIAGNOSIS — E282 Polycystic ovarian syndrome: Secondary | ICD-10-CM | POA: Diagnosis not present

## 2024-03-21 DIAGNOSIS — Z01812 Encounter for preprocedural laboratory examination: Secondary | ICD-10-CM | POA: Diagnosis not present

## 2024-05-18 DIAGNOSIS — H11421 Conjunctival edema, right eye: Secondary | ICD-10-CM | POA: Diagnosis not present

## 2024-05-18 DIAGNOSIS — H11431 Conjunctival hyperemia, right eye: Secondary | ICD-10-CM | POA: Diagnosis not present
